# Patient Record
Sex: Male | Born: 1965
Health system: Southern US, Community
[De-identification: ages and names within clinical notes are randomized; demographics above are authoritative.]

## PROBLEM LIST (undated history)

## (undated) DIAGNOSIS — R253 Fasciculation: Secondary | ICD-10-CM

## (undated) HISTORY — DX: Fasciculation: R25.3

---

## 2007-03-04 ENCOUNTER — Emergency Department: Payer: Self-pay | Admitting: Emergency Medicine

## 2007-03-04 ENCOUNTER — Other Ambulatory Visit: Payer: Self-pay

## 2007-03-11 ENCOUNTER — Ambulatory Visit: Payer: Self-pay

## 2009-06-11 IMAGING — CT CT HEAD WITHOUT CONTRAST
1 series · 16 of 30 positions shown, 20 images · non-contrast
Comparison: none

REASON FOR EXAM: Arm tingling
COMMENTS:

PROCEDURE:     CT  - CT HEAD WITHOUT CONTRAST  - March 04, 2007 [DATE]
RESULT:     The patient has a history of arm tingling.
TECHNIQUE: Nonenhanced head CT is performed.

[Series 2: soft tissue · axial · 0.39mm/px · z∈[-131,+4]mm · 16 of 30 slices shown, 20 images]
[im 2/30  brain]
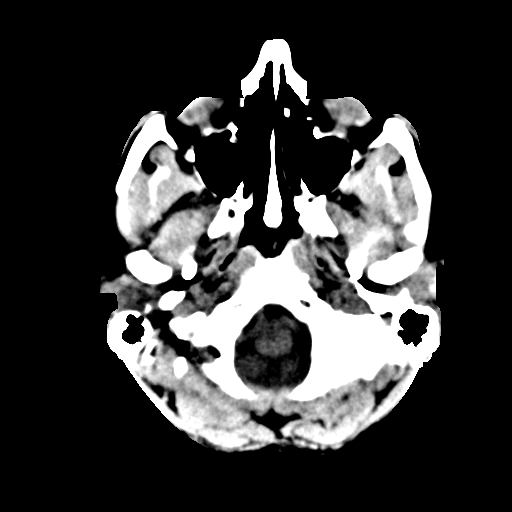
[im 2/30  bone]
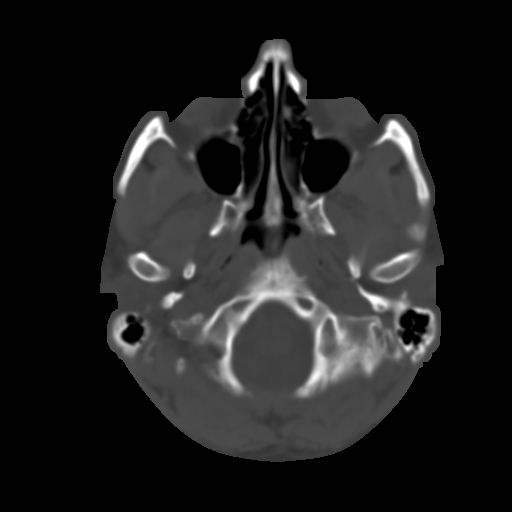
[im 4/30  brain]
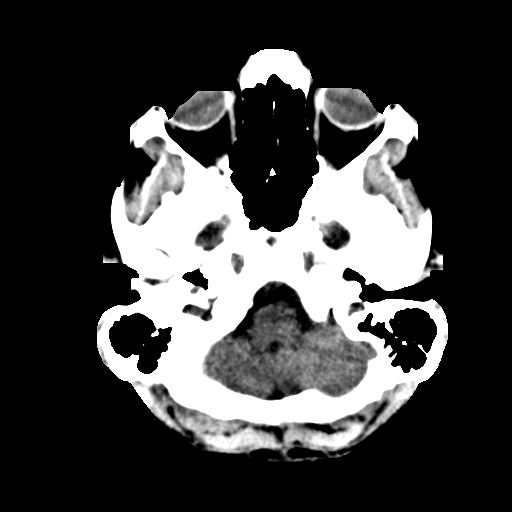
[im 6/30  brain]
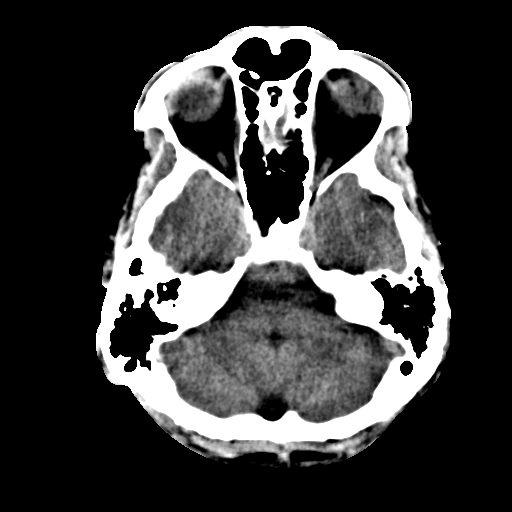
[im 8/30  brain]
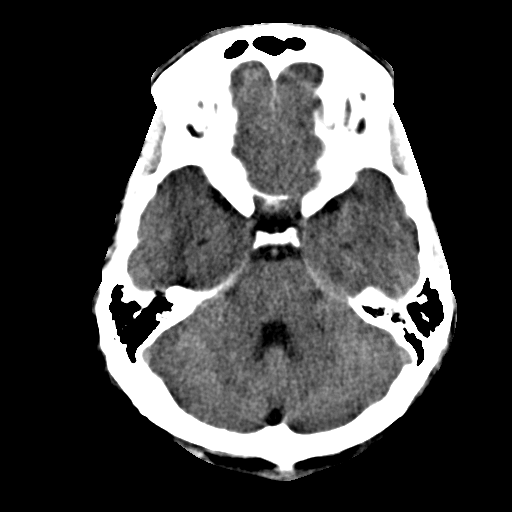
[im 9/30  brain]
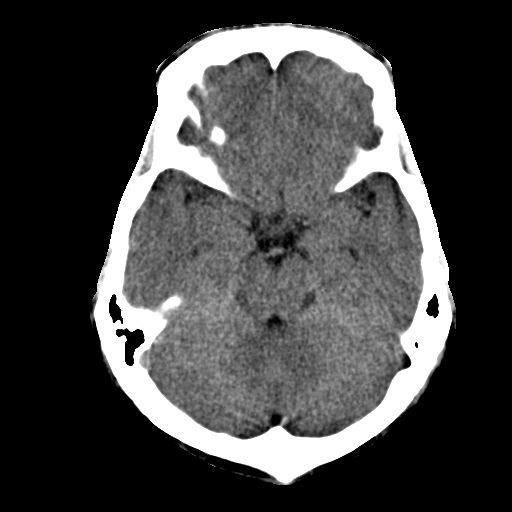
[im 9/30  bone]
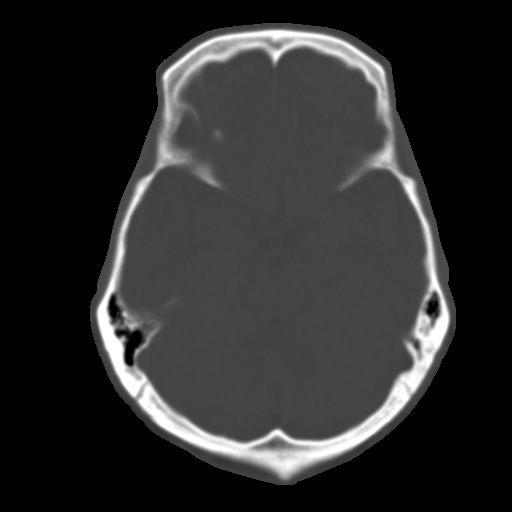
[im 11/30  brain]
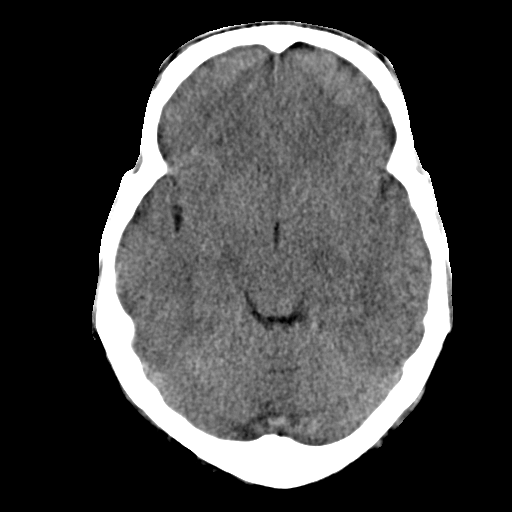
[im 13/30  brain]
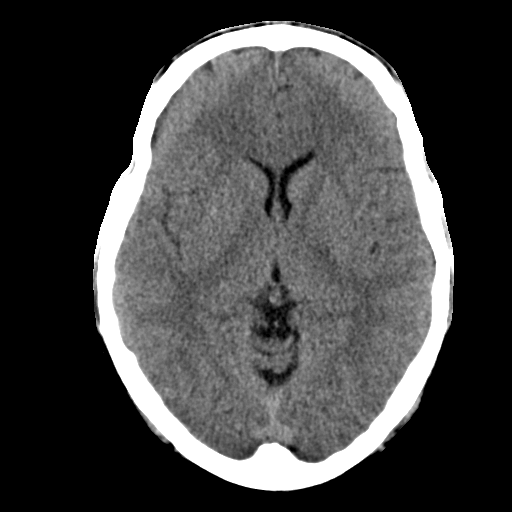
[im 15/30  brain]
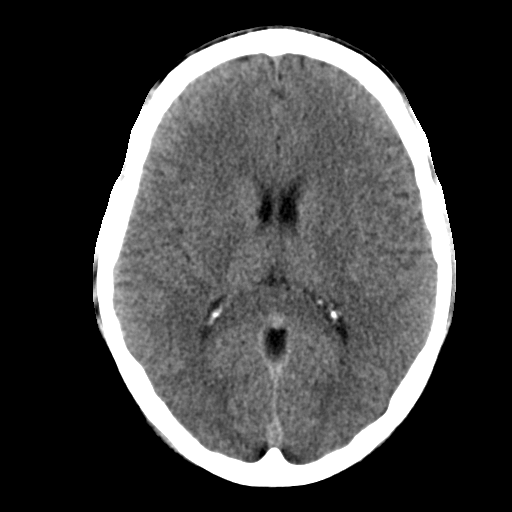
[im 16/30  brain]
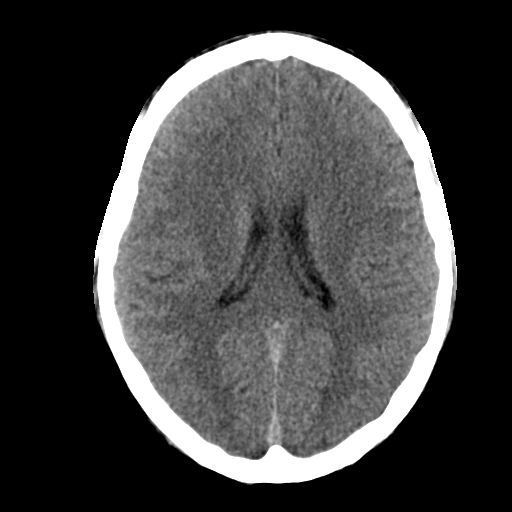
[im 16/30  bone]
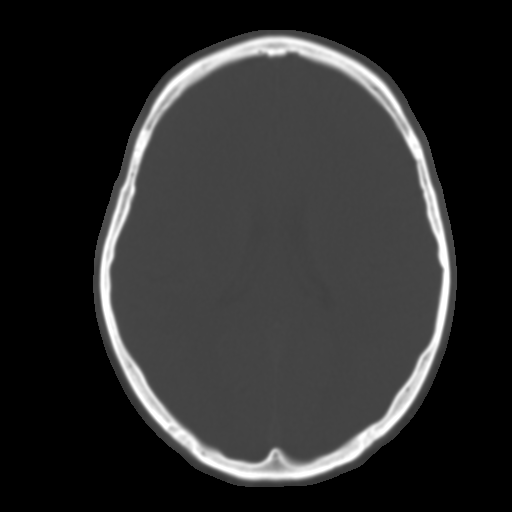
[im 18/30  brain]
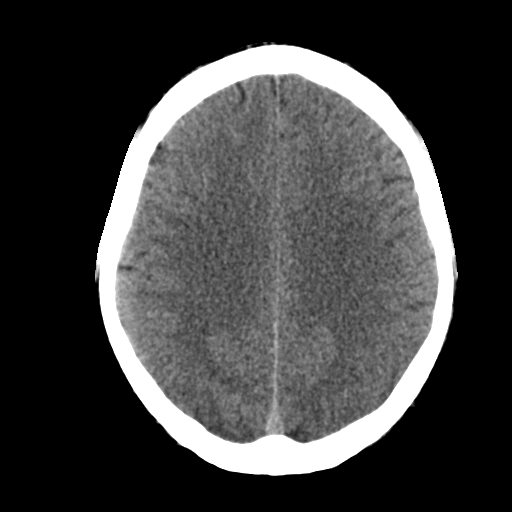
[im 20/30  brain]
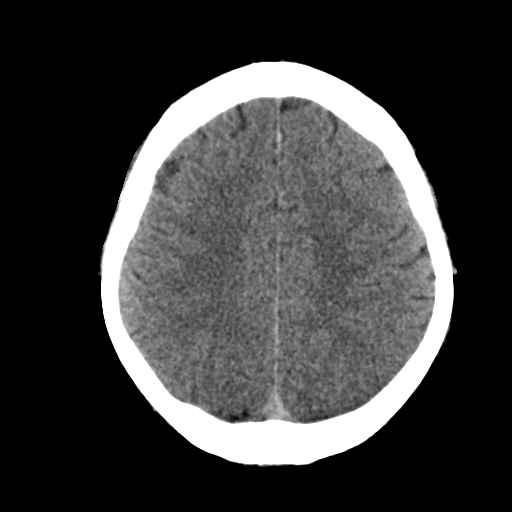
[im 22/30  brain]
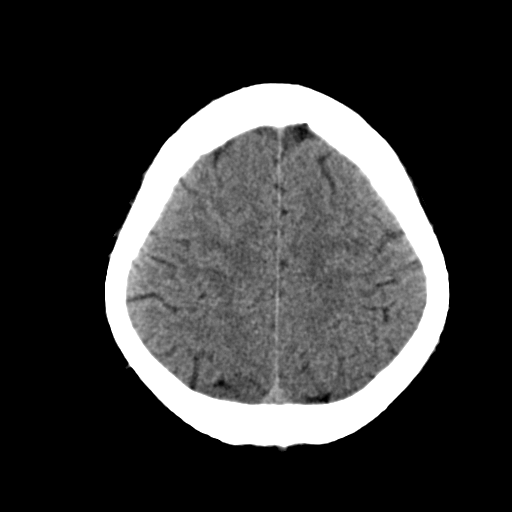
[im 23/30  brain]
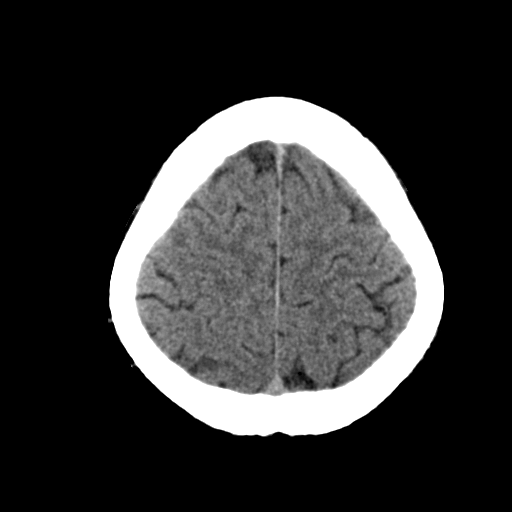
[im 23/30  bone]
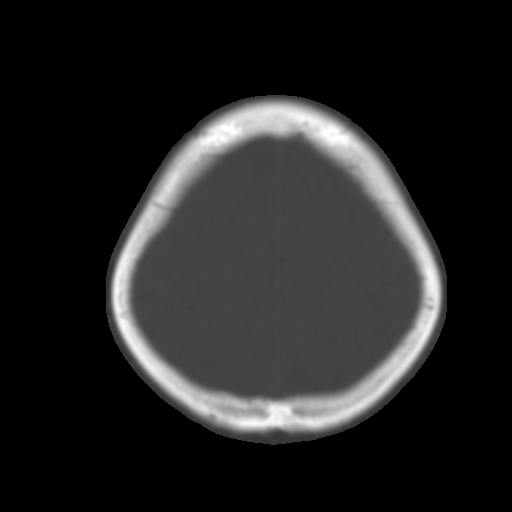
[im 25/30  brain]
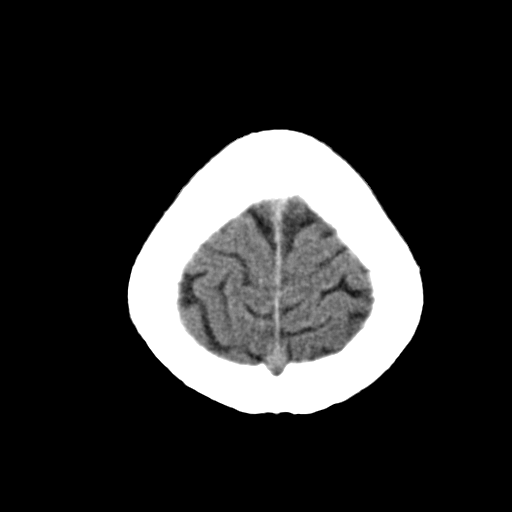
[im 27/30  brain]
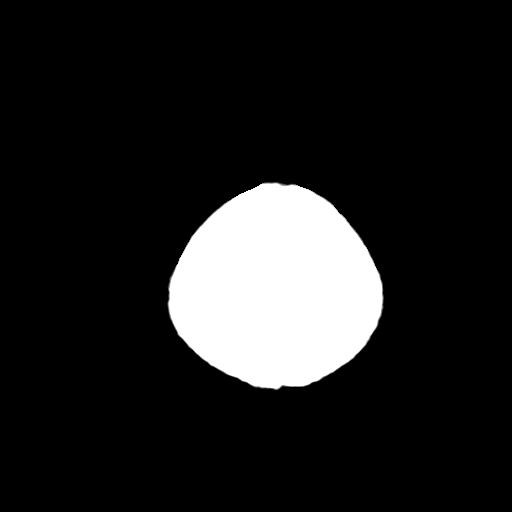
[im 29/30  brain]
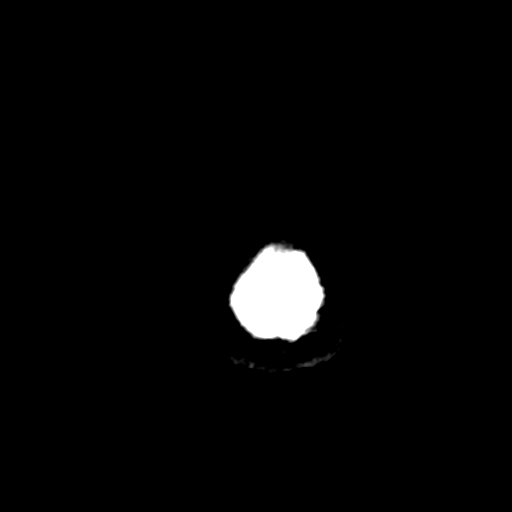

[16 of 30 positions shown; findings below may reference images not displayed]

There are no prior studies available for comparison. Comparison is made to a
prior CT report of 02/19/2003 but the films are not available for review.

No intra-axial or extra-axial pathologic fluid or blood collections are
identified. No mass lesions or hydrocephalus are noted. No bony abnormality
is identified.
IMPRESSION: No acute abnormality.

## 2013-10-01 LAB — BASIC METABOLIC PANEL
BUN: 10 mg/dL (ref 4–21)
Creatinine: 0.9 mg/dL (ref 0.6–1.3)
GLUCOSE: 117 mg/dL
POTASSIUM: 4.9 mmol/L (ref 3.4–5.3)
SODIUM: 138 mmol/L (ref 137–147)

## 2013-10-01 LAB — CBC AND DIFFERENTIAL
HCT: 47 % (ref 41–53)
Hemoglobin: 16.7 g/dL (ref 13.5–17.5)
Platelets: 223 10*3/uL (ref 150–399)
WBC: 5.3 10^3/mL

## 2013-10-01 LAB — HEPATIC FUNCTION PANEL
ALT: 29 U/L (ref 10–40)
AST: 22 U/L (ref 14–40)

## 2015-05-12 ENCOUNTER — Encounter: Payer: Self-pay | Admitting: Physician Assistant

## 2015-05-12 ENCOUNTER — Ambulatory Visit (INDEPENDENT_AMBULATORY_CARE_PROVIDER_SITE_OTHER): Payer: PRIVATE HEALTH INSURANCE | Admitting: Physician Assistant

## 2015-05-12 VITALS — BP 118/80 | HR 98 | Temp 99.6°F | Resp 16 | Wt 154.8 lb

## 2015-05-12 DIAGNOSIS — R05 Cough: Secondary | ICD-10-CM

## 2015-05-12 DIAGNOSIS — R059 Cough, unspecified: Secondary | ICD-10-CM

## 2015-05-12 DIAGNOSIS — J302 Other seasonal allergic rhinitis: Secondary | ICD-10-CM

## 2015-05-12 MED ORDER — FLUTICASONE PROPIONATE 50 MCG/ACT NA SUSP: 2.0000 | Freq: Every day | NASAL | Status: DC

## 2015-05-12 MED ORDER — HYDROCODONE-HOMATROPINE 5-1.5 MG/5ML PO SYRP: 5.0000 mL | ORAL_SOLUTION | Freq: Three times a day (TID) | ORAL | Status: DC | PRN

## 2015-05-12 NOTE — Progress Notes (Signed)
Patient: Brendan Santos Male    DOB: 10/09/1965   50 y.o.   MRN: [id number] Visit Date: 05/12/2015  Today's Provider: Margaretann Loveless, PA-C   Chief Complaint  Patient presents with  . Cough   Subjective:    Cough This is a new problem. The current episode started in the past 7 days (On Monday). The problem has been gradually worsening. The problem occurs hourly. The cough is productive of sputum (Pale white). Associated symptoms include chills, headaches, myalgias, nasal congestion, postnasal drip, rhinorrhea, a sore throat and shortness of breath. Pertinent negatives include no chest pain, ear pain, fever or wheezing. The symptoms are aggravated by lying down. Treatments tried: Took Ibuprofen, Benadryl. The treatment provided no relief.       No Known Allergies Previous Medications   DIPHENHYDRAMINE (BENADRYL) 25 MG TABLET    Take 25 mg by mouth every 6 (six) hours as needed.   LORATADINE (CLARITIN) 10 MG TABLET    Take 10 mg by mouth daily.    Review of Systems  Constitutional: Positive for chills and fatigue. Negative for fever.  HENT: Positive for postnasal drip, rhinorrhea, sinus pressure (He said he suffers from Sinusitis but lately he has notice is more than usual and it seems like he can't get  better), sneezing and sore throat. Negative for ear discharge and ear pain.   Eyes: Positive for pain and itching.  Respiratory: Positive for cough and shortness of breath. Negative for chest tightness and wheezing.   Cardiovascular: Negative for chest pain, palpitations and leg swelling.  Gastrointestinal: Negative for nausea, vomiting and abdominal pain.  Musculoskeletal: Positive for myalgias.  Neurological: Positive for headaches. Negative for dizziness.    Social History  Substance Use Topics  . Smoking status: Never Smoker   . Smokeless tobacco: Not on file  . Alcohol Use: Yes     Comment: Moderate; drinks a few beers during the week, more on the weekend.    Objective:   BP 118/80 mmHg  Pulse 98  Temp(Src) 99.6 F (37.6 C) (Oral)  Resp 16  Wt 154 lb 12.8 oz (70.217 kg)  SpO2 98%  Physical Exam  Constitutional: He appears well-developed and well-nourished. No distress.  HENT:  Head: Normocephalic and atraumatic.  Right Ear: Hearing, tympanic membrane, external ear and ear canal normal. Tympanic membrane is not erythematous and not bulging. No middle ear effusion.  Left Ear: Hearing, tympanic membrane, external ear and ear canal normal. Tympanic membrane is not erythematous and not bulging.  No middle ear effusion.  Nose: Mucosal edema and rhinorrhea present. Right sinus exhibits no maxillary sinus tenderness and no frontal sinus tenderness. Left sinus exhibits no maxillary sinus tenderness and no frontal sinus tenderness.  Mouth/Throat: Uvula is midline, oropharynx is clear and moist and mucous membranes are normal. No oropharyngeal exudate, posterior oropharyngeal edema or posterior oropharyngeal erythema.  Mild ethmoid tenderness  Eyes: Conjunctivae and EOM are normal. Pupils are equal, round, and reactive to light. Right eye exhibits no discharge. Left eye exhibits no discharge.  Neck: Normal range of motion. Neck supple. No tracheal deviation present. No Brudzinski's sign and no Kernig's sign noted. No thyromegaly present.  Cardiovascular: Normal rate, regular rhythm and normal heart sounds.  Exam reveals no gallop and no friction rub.   No murmur heard. Pulmonary/Chest: Effort normal and breath sounds normal. No stridor. No respiratory distress. He has no wheezes. He has no rales.  Lymphadenopathy:    He has  no cervical adenopathy.  Skin: Skin is warm and dry. He is not diaphoretic.  Vitals reviewed.       Assessment & Plan:     1. Other seasonal allergic rhinitis Most likely allergies. Advised to start taking claritin daily.  Will add flonase as below. May continue symptomatic treatment for time being as well. He is to call if  symptoms worsen. - fluticasone (FLONASE) 50 MCG/ACT nasal spray; Place 2 sprays into both nostrils daily.  Dispense: 16 g; Refill: 3  2. Cough Will give hycodan for nighttime cough. Advised of drowsiness precautions. He needs to stay well hydrated and get plenty of sleep. He is to call if no improvement or worsening of symptoms. - HYDROcodone-homatropine (HYCODAN) 5-1.5 MG/5ML syrup; Take 5 mLs by mouth every 8 (eight) hours as needed.  Dispense: 180 mL; Refill: 0       Margaretann LovelessJennifer M Barbarita Hutmacher, PA-C  [facility]

## 2015-05-12 NOTE — Patient Instructions (Signed)
Hay Fever Hay fever is an allergic reaction to particles in the air. It cannot be passed from person to person. It cannot be cured, but it can be controlled. CAUSES  Hay fever is caused by something that triggers an allergic reaction (allergens). The following are examples of allergens:  Ragweed.  Feathers.  Animal dander.  Grass and tree pollens.  Cigarette smoke.  House dust.  Pollution. SYMPTOMS   Sneezing.  Runny or stuffy nose.  Tearing eyes.  Itchy eyes, nose, mouth, throat, skin, or other area.  Sore throat.  Headache.  Decreased sense of smell or taste. DIAGNOSIS Your caregiver will perform a physical exam and ask questions about the symptoms you are having.Allergy testing may be done to determine exactly what triggers your hay fever.  TREATMENT   Over-the-counter medicines may help symptoms. These include:  Antihistamines.  Decongestants. These may help with nasal congestion.  Your caregiver may prescribe medicines if over-the-counter medicines do not work.  Some people benefit from allergy shots when other medicines are not helpful. HOME CARE INSTRUCTIONS   Avoid the allergen that is causing your symptoms, if possible.  Take all medicine as told by your caregiver. SEEK MEDICAL CARE IF:   You have severe allergy symptoms and your current medicines are not helping.  Your treatment was working at one time, but you are now experiencing symptoms.  You have sinus congestion and pressure.  You develop a fever or headache.  You have thick nasal discharge.  You have asthma and have a worsening cough and wheezing. SEEK IMMEDIATE MEDICAL CARE IF:   You have swelling of your tongue or lips.  You have trouble breathing.  You feel lightheaded or like you are going to faint.  You have cold sweats.  You have a fever.   This information is not intended to replace advice given to you by your health care provider. Make sure you discuss any  questions you have with your health care provider.   Document Released: 02/06/2005 Document Revised: 05/01/2011 Document Reviewed: 08/19/2014 Elsevier Interactive Patient Education 2016 Elsevier Inc.  

## 2015-05-13 DIAGNOSIS — S82899A Other fracture of unspecified lower leg, initial encounter for closed fracture: Secondary | ICD-10-CM | POA: Insufficient documentation

## 2015-05-13 DIAGNOSIS — N489 Disorder of penis, unspecified: Secondary | ICD-10-CM | POA: Insufficient documentation

## 2015-05-13 DIAGNOSIS — R253 Fasciculation: Secondary | ICD-10-CM | POA: Insufficient documentation

## 2015-05-13 DIAGNOSIS — F439 Reaction to severe stress, unspecified: Secondary | ICD-10-CM | POA: Insufficient documentation

## 2015-05-13 DIAGNOSIS — G4700 Insomnia, unspecified: Secondary | ICD-10-CM | POA: Insufficient documentation

## 2016-05-30 ENCOUNTER — Emergency Department
Admission: EM | Admit: 2016-05-30 | Discharge: 2016-05-30 | Disposition: A | Discharge status: Home / Self Care | Payer: BLUE CROSS/BLUE SHIELD | Attending: Emergency Medicine | Admitting: Emergency Medicine

## 2016-05-30 ENCOUNTER — Encounter: Payer: Self-pay | Admitting: Emergency Medicine

## 2016-05-30 DIAGNOSIS — R0789 Other chest pain: Secondary | ICD-10-CM | POA: Diagnosis not present

## 2016-05-30 DIAGNOSIS — R079 Chest pain, unspecified: Secondary | ICD-10-CM | POA: Diagnosis not present

## 2016-05-30 LAB — BASIC METABOLIC PANEL
ANION GAP: 7 (ref 5–15)
BUN: 12 mg/dL (ref 6–20)
CHLORIDE: 100 mmol/L — AB (ref 101–111)
CO2: 29 mmol/L (ref 22–32)
Calcium: 9.2 mg/dL (ref 8.9–10.3)
Creatinine, Ser: 0.94 mg/dL (ref 0.61–1.24)
GFR calc Af Amer: >60 mL/min (ref >60)
GFR calc non Af Amer: >60 mL/min (ref >60)
GLUCOSE: 207 mg/dL — AB (ref 65–99)
POTASSIUM: 3.8 mmol/L (ref 3.5–5.1)
Sodium: 136 mmol/L (ref 135–145)

## 2016-05-30 LAB — CBC
HEMATOCRIT: 44.8 % (ref 40.0–52.0)
Hemoglobin: 15.2 g/dL (ref 13.0–18.0)
MCH: 31.7 pg (ref 26.0–34.0)
MCHC: 34 g/dL (ref 32.0–36.0)
MCV: 93.2 fL (ref 80.0–100.0)
Platelets: 187 10*3/uL (ref 150–440)
RBC: 4.8 MIL/uL (ref 4.40–5.90)
RDW: 12.8 % (ref 11.5–14.5)
WBC: 6.3 10*3/uL (ref 3.8–10.6)

## 2016-05-30 LAB — TROPONIN I: Troponin I: <0.03 ng/mL (ref <0.03)

## 2016-05-30 MED ORDER — GI COCKTAIL ~~LOC~~
30.0000 mL | Freq: Once | ORAL | Status: AC
  Administered 2016-05-30: 30 mL via ORAL

## 2016-05-30 MED ORDER — GI COCKTAIL ~~LOC~~
ORAL | Status: AC
  Administered 2016-05-30: 30 mL via ORAL
  Filled 2016-05-30: qty 30

## 2016-05-30 NOTE — ED Notes (Signed)
Pt states L sided CP began yesterday after getting mad. States L arm numbness and cramping. States went to UC today, was given 2 nitro, states didn't help that much. Went to [employer] and had 2 beers which "that helped me some." per pt. Pt denies vomiting, diarrhea, fever. States when pain began yest had some diaphoresis and nausea. No distress noted, alert and oriented.

## 2016-05-30 NOTE — Discharge Instructions (Signed)
Please seek medical attention for any high fevers, chest pain, shortness of breath, change in behavior, persistent vomiting, bloody stool or any other new or concerning symptoms.  

## 2016-05-30 NOTE — ED Triage Notes (Signed)
Pt has had chest pain since yest, states no hx of heart disease. Went to urgent care today and had ekg and cxr done, was given nitro x 2 at the office states now pain seems to be better.

## 2016-05-30 NOTE — ED Provider Notes (Signed)
[facility] Emergency Department Provider Note   ____________________________________________   I have reviewed the triage vital signs and the nursing notes.   HISTORY  Chief Complaint Chest Pain   History limited by: Not Limited   HPI Brendan Santos is a 51 y.o. male who presents to the emergency department today because of concerns for continued chest pain. Patient states that it started yesterday while he was having an argument. He describes it as being intermittently burning and pressure-like. It is located in his left central chest. This has been accompanied by some left arm numbness. Has not had any pain in his left arm. Initially went to urgent care where he was given some nitroglycerin which he says might have helped but only very temporarily. The pain returned with 10 minutes. He denies similar pain in the past.   Past Medical History:  Diagnosis Date  . Muscle twitch     Patient Active Problem List   Diagnosis Date Noted  . Closed fracture of ankle 05/13/2015  . Cannot sleep 05/13/2015  . Lesion of penis 05/13/2015  . Muscle twitch 05/13/2015  . Feeling stressed out 05/13/2015    No past surgical history on file.  Prior to Admission medications   Medication Sig Start Date End Date Taking? Authorizing Provider  diphenhydrAMINE (BENADRYL) 25 MG tablet Take 25 mg by mouth every 6 (six) hours as needed.    Historical Provider, MD  fluticasone (FLONASE) 50 MCG/ACT nasal spray Place 2 sprays into both nostrils daily. 05/12/15   Margaretann Loveless, PA-C  HYDROcodone-homatropine (HYCODAN) 5-1.5 MG/5ML syrup Take 5 mLs by mouth every 8 (eight) hours as needed. 05/12/15   Margaretann Loveless, PA-C  loratadine (CLARITIN) 10 MG tablet Take 10 mg by mouth daily.    Historical Provider, MD    Allergies Patient has no known allergies.  Family History  Problem Relation Age of Onset  . Diabetes Mother   . Heart disease Father   . Cancer Father    . Cancer Paternal Grandmother     Social History Social History  Substance Use Topics  . Smoking status: Never Smoker  . Smokeless tobacco: Not on file  . Alcohol use Yes     Comment: Moderate; drinks a few beers during the week, more on the weekend.    Review of Systems  Constitutional: Negative for fever. Cardiovascular: Positive for chest pain. Respiratory: Negative for shortness of breath. Gastrointestinal: Negative for abdominal pain, vomiting and diarrhea. Genitourinary: Negative for dysuria. Musculoskeletal: Positive for left arm numbness. Skin: Negative for rash. Neurological: Negative for headaches, focal weakness or numbness.  10-point ROS otherwise negative.  ____________________________________________   PHYSICAL EXAM:  VITAL SIGNS: ED Triage Vitals  Enc Vitals Group     BP 05/30/16 1917 108/90     Pulse Rate 05/30/16 1917 73     Resp 05/30/16 1917 18     Temp 05/30/16 1917 97.8 F (36.6 C)     Temp Source 05/30/16 1917 Oral     SpO2 05/30/16 1917 98 %     Weight 05/30/16 1912 155 lb (70.3 kg)     Height 05/30/16 1912  (1.651 m)     Head Circumference --      Peak Flow --      Pain Score 05/30/16 1912 2   Constitutional: Alert and oriented. Well appearing and in no distress. Eyes: Conjunctivae are normal. Normal extraocular movements. ENT   Head: Normocephalic and atraumatic.   Nose:  No congestion/rhinnorhea.   Mouth/Throat: Mucous membranes are moist.   Neck: No stridor. Hematological/Lymphatic/Immunilogical: No cervical lymphadenopathy. Cardiovascular: Normal rate, regular rhythm.  No murmurs, rubs, or gallops.  Respiratory: Normal respiratory effort without tachypnea nor retractions. Breath sounds are clear and equal bilaterally. No wheezes/rales/rhonchi. Gastrointestinal: Soft and non tender. No rebound. No guarding.  Genitourinary: Deferred Musculoskeletal: Normal range of motion in all extremities. No lower extremity  edema. Neurologic:  Normal speech and language. No gross focal neurologic deficits are appreciated.  Skin:  Skin is warm, dry and intact. No rash noted. Psychiatric: Mood and affect are normal. Speech and behavior are normal. Patient exhibits appropriate insight and judgment.  ____________________________________________    LABS (pertinent positives/negatives)  Labs Reviewed  BASIC METABOLIC PANEL - Abnormal; Notable for the following:       Result Value   Chloride 100 (*)    Glucose, Bld 207 (*)    All other components within normal limits  CBC  TROPONIN I     ____________________________________________   EKG  I, Phineas Semen, attending physician, personally viewed and interpreted this EKG  EKG Time: 1913 Rate: 73 Rhythm: normal sinus rhythm Axis: normal Intervals: qtc 401 QRS: narrow ST changes: no st elevation Impression: normal ekg   ____________________________________________    RADIOLOGY  None  ____________________________________________   PROCEDURES  Procedures  ____________________________________________   INITIAL IMPRESSION / ASSESSMENT AND PLAN / ED COURSE  Pertinent labs & imaging results that were available during my care of the patient were reviewed by me and considered in my medical decision making (see chart for details).  Patient presented to the emergency department because of chest pain. Has been constant since yesterday. Troponin was negative here. At this point I think if chest pain represented ACS out expect some elevation of the troponin. At this point unclear etiology. This did start during an argument and when her stress or anxiety could be related. Will give patient a cardiology follow-up information.  ____________________________________________   FINAL CLINICAL IMPRESSION(S) / ED DIAGNOSES  Final diagnoses:  Nonspecific chest pain     Note: This dictation was prepared with Dragon dictation. Any transcriptional  errors that result from this process are unintentional     Phineas Semen, MD 05/31/16 1502

## 2016-05-31 ENCOUNTER — Encounter: Payer: PRIVATE HEALTH INSURANCE | Admitting: Family Medicine

## 2016-06-02 DIAGNOSIS — R079 Chest pain, unspecified: Secondary | ICD-10-CM | POA: Insufficient documentation

## 2016-07-31 ENCOUNTER — Other Ambulatory Visit: Payer: Self-pay | Admitting: Family Medicine

## 2016-07-31 ENCOUNTER — Other Ambulatory Visit: Payer: Self-pay

## 2016-07-31 ENCOUNTER — Encounter: Payer: Self-pay | Admitting: Family Medicine

## 2016-07-31 ENCOUNTER — Ambulatory Visit (INDEPENDENT_AMBULATORY_CARE_PROVIDER_SITE_OTHER): Payer: BLUE CROSS/BLUE SHIELD | Admitting: Family Medicine

## 2016-07-31 VITALS — BP 102/64 | HR 76 | Temp 98.5°F | Resp 16 | Ht 65.5 in | Wt 150.4 lb

## 2016-07-31 DIAGNOSIS — Z113 Encounter for screening for infections with a predominantly sexual mode of transmission: Secondary | ICD-10-CM | POA: Diagnosis not present

## 2016-07-31 DIAGNOSIS — Z125 Encounter for screening for malignant neoplasm of prostate: Secondary | ICD-10-CM | POA: Diagnosis not present

## 2016-07-31 DIAGNOSIS — Z13220 Encounter for screening for lipoid disorders: Secondary | ICD-10-CM | POA: Diagnosis not present

## 2016-07-31 DIAGNOSIS — Z1211 Encounter for screening for malignant neoplasm of colon: Secondary | ICD-10-CM

## 2016-07-31 DIAGNOSIS — M20029 Boutonniere deformity of unspecified finger(s): Secondary | ICD-10-CM | POA: Insufficient documentation

## 2016-07-31 DIAGNOSIS — Z0000 Encounter for general adult medical examination without abnormal findings: Secondary | ICD-10-CM

## 2016-07-31 DIAGNOSIS — Z131 Encounter for screening for diabetes mellitus: Secondary | ICD-10-CM

## 2016-07-31 NOTE — Patient Instructions (Addendum)
We will call you with the lab results. Place hydrocortisone cream on your penis rash 2-3 x day.

## 2016-07-31 NOTE — Progress Notes (Signed)
Subjective:     Patient ID: Brendan Santos, male   DOB: Jun 26, 1965, 51 y.o.   MRN: [id number]  HPI  Chief Complaint  Patient presents with  . Annual Exam    Patient comes into office today for his annual physical, patient states that he is feeling well today and has no complaints or concerns. Paitent reports that he sleeps on average 6hrs a night, is following a well balanced diet, patient is not actively exercising and states his libido is normal.Last reported Tdap was 10/18/10, patient is due today for screening labs and colonoscopy screen.   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   Review of Systems General: Feeling well HEENT: regular dental visits and eye exam pending. Cardiovascular: no chest pain, shortness of breath, or palpitations GI: no heartburn, no change in bowel habits or blood in the stool GU: nocturia x 0, no change in bladder habits. Reports unprotected sex 6 months ago (none since then) and has noticed an itchy bump on his penis yesterday. Hx of HPV treated with cryotherapy. Psychiatric: not depressed Musculoskeletal: no joint pain    Objective:   Physical Exam  Constitutional: He appears well-developed and well-nourished. No distress.  Eyes: PERRLA, EOMI Neck: no thyromegaly, tenderness or nodules, no cervical adenopathy or carotid bruits. ENT: TM's intact without inflammation; No tonsillar enlargement or exudate, Lungs: Clear Heart : RRR without murmur or gallop Abd: bowel sounds present, soft, non-tender, no organomegaly Rectal: Prostate firm and non-tender,  Extremities: no edema, Skin: 0.5 cm raised linear rash noted on his penis shaft. No vesicles.     Assessment:    1. Screen for STD (sexually transmitted disease) - HIV antibody - GC/chlamydia probe amp, urine - HSV(herpes simplex vrs) 1+2 ab-IgG - RPR  2. Annual physical exam  3. Screening for cholesterol level - Lipid panel  4. Screening for diabetes mellitus - Comprehensive metabolic  panel  5. Screen for colon cancer - Ambulatory referral to Gastroenterology  6. Screening for prostate cancer - PSA    Plan:    Further f/u pending lab work. Discussed using hydrocortisone cream on his penis.

## 2016-08-02 LAB — SPECIMEN STATUS REPORT

## 2016-08-02 LAB — GC/CHLAMYDIA PROBE AMP
Chlamydia trachomatis, NAA: NEGATIVE
NEISSERIA GONORRHOEAE BY PCR: NEGATIVE

## 2016-08-07 NOTE — Progress Notes (Signed)
Advised  ED 

## 2016-08-15 ENCOUNTER — Other Ambulatory Visit: Payer: Self-pay

## 2016-08-15 ENCOUNTER — Telehealth: Payer: Self-pay

## 2016-08-15 DIAGNOSIS — Z1211 Encounter for screening for malignant neoplasm of colon: Secondary | ICD-10-CM

## 2016-08-15 NOTE — Telephone Encounter (Signed)
Gastroenterology Pre-Procedure Review  Request Date:  Requesting Physician: Dr.   PATIENT REVIEW QUESTIONS: The patient responded to the following health history questions as indicated:    1. Are you having any GI issues? no 2. Do you have a personal history of Polyps? no 3. Do you have a family history of Colon Cancer or Polyps? no 4. Diabetes Mellitus? no 5. Joint replacements in the past 12 months?no 6. Major health problems in the past 3 months?no 7. Any artificial heart valves, MVP, or defibrillator?no    MEDICATIONS & ALLERGIES:    Patient reports the following regarding taking any anticoagulation/antiplatelet therapy:   Plavix, Coumadin, Eliquis, Xarelto, Lovenox, Pradaxa, Brilinta, or Effient? no Aspirin? no  Patient confirms/reports the following medications:  Current Outpatient Prescriptions  Medication Sig Dispense Refill  . fluticasone (FLONASE) 50 MCG/ACT nasal spray Place 2 sprays into both nostrils daily. 16 g 3  . ofloxacin (OCUFLOX) 0.3 % ophthalmic solution ofloxacin 0.3 % eye drops     No current facility-administered medications for this visit.     Patient confirms/reports the following allergies:  Allergies  Allergen Reactions  . No Known Allergies     No orders of the defined types were placed in this encounter.   AUTHORIZATION INFORMATION Primary Insurance: 1D#: Group #:  Secondary Insurance: 1D#: Group #:  SCHEDULE INFORMATION: Date: 09/14/16 Time: Location: [facility]

## 2016-09-05 ENCOUNTER — Telehealth: Payer: Self-pay | Admitting: Gastroenterology

## 2016-09-05 NOTE — Telephone Encounter (Signed)
Due to work schedule patient has to cx his procedure.

## 2016-09-06 NOTE — Telephone Encounter (Signed)
Patient contacted to let him know his colonoscopy has been canceled as he requested and to call back to reschedule when ready.

## 2016-09-14 ENCOUNTER — Encounter: Admission: RE | Payer: Self-pay | Source: Ambulatory Visit

## 2016-09-14 ENCOUNTER — Ambulatory Visit
Admission: RE | Admit: 2016-09-14 | Payer: BLUE CROSS/BLUE SHIELD | Source: Ambulatory Visit | Admitting: Gastroenterology

## 2016-09-14 SURGERY — COLONOSCOPY WITH PROPOFOL
Anesthesia: General

## 2016-10-04 DIAGNOSIS — Z125 Encounter for screening for malignant neoplasm of prostate: Secondary | ICD-10-CM | POA: Diagnosis not present

## 2016-10-04 DIAGNOSIS — Z131 Encounter for screening for diabetes mellitus: Secondary | ICD-10-CM | POA: Diagnosis not present

## 2016-10-04 DIAGNOSIS — Z13220 Encounter for screening for lipoid disorders: Secondary | ICD-10-CM | POA: Diagnosis not present

## 2016-10-04 DIAGNOSIS — Z113 Encounter for screening for infections with a predominantly sexual mode of transmission: Secondary | ICD-10-CM | POA: Diagnosis not present

## 2016-10-05 ENCOUNTER — Encounter: Payer: Self-pay | Admitting: Family Medicine

## 2016-10-05 DIAGNOSIS — B009 Herpesviral infection, unspecified: Secondary | ICD-10-CM | POA: Insufficient documentation

## 2016-10-05 LAB — LIPID PANEL
CHOLESTEROL TOTAL: 291 mg/dL — AB (ref 100–199)
Chol/HDL Ratio: 4.3 ratio (ref 0.0–5.0)
HDL: 67 mg/dL (ref >39)
LDL Calculated: 205 mg/dL — ABNORMAL HIGH (ref 0–99)
Triglycerides: 94 mg/dL (ref 0–149)
VLDL Cholesterol Cal: 19 mg/dL (ref 5–40)

## 2016-10-05 LAB — COMPREHENSIVE METABOLIC PANEL
ALBUMIN: 4.6 g/dL (ref 3.5–5.5)
ALT: 67 IU/L — ABNORMAL HIGH (ref 0–44)
AST: 41 IU/L — AB (ref 0–40)
Albumin/Globulin Ratio: 1.7 (ref 1.2–2.2)
Alkaline Phosphatase: 58 IU/L (ref 39–117)
BUN/Creatinine Ratio: 13 (ref 9–20)
BUN: 12 mg/dL (ref 6–24)
Bilirubin Total: 0.4 mg/dL (ref 0.0–1.2)
CALCIUM: 9.1 mg/dL (ref 8.7–10.2)
CO2: 27 mmol/L (ref 20–29)
CREATININE: 0.92 mg/dL (ref 0.76–1.27)
Chloride: 101 mmol/L (ref 96–106)
GFR calc Af Amer: 111 mL/min/{1.73_m2} (ref >59)
GFR, EST NON AFRICAN AMERICAN: 96 mL/min/{1.73_m2} (ref >59)
GLOBULIN, TOTAL: 2.7 g/dL (ref 1.5–4.5)
Glucose: 103 mg/dL — ABNORMAL HIGH (ref 65–99)
Potassium: 4.8 mmol/L (ref 3.5–5.2)
SODIUM: 142 mmol/L (ref 134–144)
Total Protein: 7.3 g/dL (ref 6.0–8.5)

## 2016-10-05 LAB — HSV(HERPES SIMPLEX VRS) I + II AB-IGG: HSV 1 Glycoprotein G Ab, IgG: 51.3 index — ABNORMAL HIGH (ref 0.00–0.90)

## 2016-10-05 LAB — PSA: PROSTATE SPECIFIC AG, SERUM: 1.2 ng/mL (ref 0.0–4.0)

## 2016-10-05 LAB — HIV ANTIBODY (ROUTINE TESTING W REFLEX): HIV SCREEN 4TH GENERATION: NONREACTIVE

## 2016-10-05 LAB — RPR: RPR: NONREACTIVE

## 2016-10-06 ENCOUNTER — Telehealth: Payer: Self-pay

## 2016-10-06 NOTE — Telephone Encounter (Signed)
[id number] 

## 2016-10-06 NOTE — Telephone Encounter (Signed)
Patient was advised of lab report. KW 

## 2016-10-06 NOTE — Telephone Encounter (Signed)
-----   Message from Anola Gurney, [place] sent at 10/05/2016  8:08 AM EDT ----- Cholesterol is elevated but your calculated 10 year risk for developing cardiovascular disease is low at 3.2%. We usually recommend cholesterol lowering drugs at 7.5% risk. 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 The remainder of your STD screen is ok. You have very mild elevation of a couple of liver tests. May wish to check this again in the next month or so. Avoid alcohol a week prior to testing.

## 2018-01-15 ENCOUNTER — Encounter: Payer: Self-pay | Admitting: Family Medicine

## 2018-01-15 ENCOUNTER — Ambulatory Visit: Payer: BLUE CROSS/BLUE SHIELD | Admitting: Family Medicine

## 2018-01-15 VITALS — BP 122/86 | HR 88 | Temp 97.9°F | Resp 16 | Wt 145.2 lb

## 2018-01-15 DIAGNOSIS — J069 Acute upper respiratory infection, unspecified: Secondary | ICD-10-CM | POA: Diagnosis not present

## 2018-01-15 DIAGNOSIS — Z113 Encounter for screening for infections with a predominantly sexual mode of transmission: Secondary | ICD-10-CM | POA: Diagnosis not present

## 2018-01-15 NOTE — Patient Instructions (Addendum)
Discussed use of Mucinex D for congestion, Delsym for cough, and Benadryl for postnasal drainage.We will call you with the lab results. 

## 2018-01-15 NOTE — Progress Notes (Addendum)
  Subjective:     Patient ID: Fonda Kinderlinton L Yeakle, male   DOB: 05/04/1965, 52 y.o.   MRN: [id number] Chief Complaint  Patient presents with  . Sore Throat    Patient comes in office today with concerns of cold like symptoms for the past 3 days. Patient reports cough, post nasal drop and sinus pain associated. Patient has been taking otc Aleve and nasal spray for relief.    HPI States he is feeling better today. Also states he wishes to have an STD screen. Reports a contact a few months ago but  denies sx.   Review of Systems     Objective:   Physical Exam  Constitutional: He appears well-developed and well-nourished. No distress.  Ears: T.M's intact without inflammation Throat: no tonsillar enlargement or exudate Neck: no cervical adenopathy Lungs: clear     Assessment:    1. URI, acute  2. Screen for STD (sexually transmitted disease) - HIV Antibody (routine testing w rflx) - RPR    Plan:    Discussed otc medication. Further f/u pending lab results. Work excuse for 11/25-11/27/19.

## 2018-01-16 ENCOUNTER — Telehealth: Payer: Self-pay

## 2018-01-16 LAB — HIV ANTIBODY (ROUTINE TESTING W REFLEX): HIV Screen 4th Generation wRfx: NONREACTIVE

## 2018-01-16 LAB — RPR: RPR Ser Ql: NONREACTIVE

## 2018-01-16 NOTE — Telephone Encounter (Signed)
-----   Message from Anola Gurneyobert Chauvin, [place] sent at 01/16/2018  7:22 AM EST ----- HIV and syphilis labs normal

## 2018-01-16 NOTE — Telephone Encounter (Signed)
Advised  ED 

## 2018-01-16 NOTE — Telephone Encounter (Signed)
[id number] 

## 2019-01-15 DIAGNOSIS — H9211 Otorrhea, right ear: Secondary | ICD-10-CM | POA: Diagnosis not present

## 2019-01-15 DIAGNOSIS — H9201 Otalgia, right ear: Secondary | ICD-10-CM | POA: Diagnosis not present

## 2019-02-06 DIAGNOSIS — L570 Actinic keratosis: Secondary | ICD-10-CM | POA: Diagnosis not present

## 2019-02-06 DIAGNOSIS — L218 Other seborrheic dermatitis: Secondary | ICD-10-CM | POA: Diagnosis not present

## 2019-02-06 DIAGNOSIS — L821 Other seborrheic keratosis: Secondary | ICD-10-CM | POA: Diagnosis not present

## 2019-02-06 DIAGNOSIS — C44619 Basal cell carcinoma of skin of left upper limb, including shoulder: Secondary | ICD-10-CM | POA: Diagnosis not present

## 2019-02-06 DIAGNOSIS — D492 Neoplasm of unspecified behavior of bone, soft tissue, and skin: Secondary | ICD-10-CM | POA: Diagnosis not present

## 2019-03-10 DIAGNOSIS — R259 Unspecified abnormal involuntary movements: Secondary | ICD-10-CM | POA: Diagnosis not present

## 2019-03-10 DIAGNOSIS — G479 Sleep disorder, unspecified: Secondary | ICD-10-CM | POA: Diagnosis not present

## 2019-03-10 DIAGNOSIS — M62838 Other muscle spasm: Secondary | ICD-10-CM | POA: Diagnosis not present

## 2019-05-26 ENCOUNTER — Ambulatory Visit: Payer: BC Managed Care – PPO | Attending: Internal Medicine

## 2019-06-12 ENCOUNTER — Ambulatory Visit: Payer: BC Managed Care – PPO | Attending: Internal Medicine

## 2019-06-12 DIAGNOSIS — Z23 Encounter for immunization: Secondary | ICD-10-CM

## 2019-06-12 NOTE — Progress Notes (Signed)
   Covid-19 Vaccination Clinic  Name:  KALED ALLENDE    MRN: [id number] DOB: [birth year]  06/12/2019  Mr. Edgerly was observed post Covid-19 immunization for 15 minutes without incident. He was provided with Vaccine Information Sheet and instruction to access the V-Safe system.   Mr. Drost was instructed to call 911 with any severe reactions post vaccine: Marland Kitchen Difficulty breathing  . Swelling of face and throat  . A fast heartbeat  . A bad rash all over body  . Dizziness and weakness   Immunizations Administered    Name Date Dose VIS Date Route   Pfizer COVID-19 Vaccine 06/12/2019  9:34 AM 0.3 mL 04/16/2018 Intramuscular   Manufacturer: [employer], [employer]   Lot: [id number]   NDC: [address]-3

## 2019-07-08 ENCOUNTER — Ambulatory Visit: Payer: BC Managed Care – PPO | Attending: Internal Medicine

## 2019-07-08 DIAGNOSIS — Z23 Encounter for immunization: Secondary | ICD-10-CM

## 2019-07-08 NOTE — Progress Notes (Signed)
   Covid-19 Vaccination Clinic  Name:  Brendan Santos    MRN: [id number] DOB: 1965-05-18  07/08/2019  [name] was observed post Covid-19 immunization for 15 minutes without incident. He was provided with Vaccine Information Sheet and instruction to access the V-Safe system.   [name] was instructed to call 911 with any severe reactions post vaccine: Marland Kitchen Difficulty breathing  . Swelling of face and throat  . A fast heartbeat  . A bad rash all over body  . Dizziness and weakness   Immunizations Administered    Name Date Dose VIS Date Route   Pfizer COVID-19 Vaccine 07/08/2019  9:25 AM 0.3 mL 04/16/2018 Intramuscular   Manufacturer: [employer], [employer]   Lot: [id number]   NDC: [address]-8

## 2020-05-28 ENCOUNTER — Ambulatory Visit: Payer: Self-pay

## 2020-05-28 NOTE — Telephone Encounter (Signed)
Noted  

## 2020-05-28 NOTE — Telephone Encounter (Signed)
Pt. Started having cough and congestion 1 week ago. Started having chest pain 3 days ago. Radiates into his jaw and back. Pain 7/10. Has shortness of breath, fatigue. Pain has been constant. Instructed to call 911. And go to ED. Verbalizes understanding.  Reason for Disposition . Pain also in shoulder(s) or arm(s) or jaw (Exception: pain is clearly made worse by movement)  Answer Assessment - Initial Assessment Questions 1. LOCATION: "Where does it hurt?"       Center 2. RADIATION: "Does the pain go anywhere else?" (e.g., into neck, jaw, arms, back)     Jaw, back 3. ONSET: "When did the chest pain begin?" (Minutes, hours or days)      3 days 4. PATTERN "Does the pain come and go, or has it been constant since it started?"  "Does it get worse with exertion?"      Constant 5. DURATION: "How long does it last" (e.g., seconds, minutes, hours)     Constant 6. SEVERITY: "How bad is the pain?"  (e.g., Scale 1-10; mild, moderate, or severe)    - MILD (1-3): doesn't interfere with normal activities     - MODERATE (4-7): interferes with normal activities or awakens from sleep    - SEVERE (8-10): excruciating pain, unable to do any normal activities       Now- 7 7. CARDIAC RISK FACTORS: "Do you have any history of heart problems or risk factors for heart disease?" (e.g., angina, prior heart attack; diabetes, high blood pressure, high cholesterol, smoker, or strong family history of heart disease)     No 8. PULMONARY RISK FACTORS: "Do you have any history of lung disease?"  (e.g., blood clots in lung, asthma, emphysema, birth control pills)     No 9. CAUSE: "What do you think is causing the chest pain?"     Unsure 10. OTHER SYMPTOMS: "Do you have any other symptoms?" (e.g., dizziness, nausea, vomiting, sweating, fever, difficulty breathing, cough)       Cough, congestion,fever,shortness of breath,fatigue 11. PREGNANCY: "Is there any chance you are pregnant?" "When was your last menstrual period?"        n/a  Protocols used: CHEST PAIN-A-AH

## 2021-03-12 ENCOUNTER — Other Ambulatory Visit: Payer: Self-pay

## 2021-03-12 ENCOUNTER — Emergency Department
Admission: EM | Admit: 2021-03-12 | Discharge: 2021-03-12 | Disposition: A | Discharge status: Home / Self Care | Payer: BC Managed Care – PPO | Attending: Emergency Medicine | Admitting: Emergency Medicine

## 2021-03-12 ENCOUNTER — Emergency Department: Payer: BC Managed Care – PPO

## 2021-03-12 DIAGNOSIS — S39011A Strain of muscle, fascia and tendon of abdomen, initial encounter: Secondary | ICD-10-CM | POA: Diagnosis not present

## 2021-03-12 DIAGNOSIS — R109 Unspecified abdominal pain: Secondary | ICD-10-CM | POA: Diagnosis present

## 2021-03-12 DIAGNOSIS — T148XXA Other injury of unspecified body region, initial encounter: Secondary | ICD-10-CM

## 2021-03-12 DIAGNOSIS — X58XXXA Exposure to other specified factors, initial encounter: Secondary | ICD-10-CM | POA: Insufficient documentation

## 2021-03-12 LAB — URINALYSIS, ROUTINE W REFLEX MICROSCOPIC
Bilirubin Urine: NEGATIVE
Glucose, UA: NEGATIVE mg/dL
Hgb urine dipstick: NEGATIVE
Ketones, ur: 5 mg/dL — AB
Leukocytes,Ua: NEGATIVE
Nitrite: NEGATIVE
Protein, ur: NEGATIVE mg/dL
Specific Gravity, Urine: 1.016 (ref 1.005–1.030)
pH: 5 (ref 5.0–8.0)

## 2021-03-12 LAB — CBC
HCT: 44.2 % (ref 39.0–52.0)
Hemoglobin: 14.8 g/dL (ref 13.0–17.0)
MCH: 32.3 pg (ref 26.0–34.0)
MCHC: 33.5 g/dL (ref 30.0–36.0)
MCV: 96.5 fL (ref 80.0–100.0)
Platelets: 232 10*3/uL (ref 150–400)
RBC: 4.58 MIL/uL (ref 4.22–5.81)
RDW: 11.9 % (ref 11.5–15.5)
WBC: 4.2 10*3/uL (ref 4.0–10.5)
nRBC: 0 % (ref 0.0–0.2)

## 2021-03-12 LAB — COMPREHENSIVE METABOLIC PANEL
ALT: 25 U/L (ref 0–44)
AST: 21 U/L (ref 15–41)
Albumin: 4.1 g/dL (ref 3.5–5.0)
Alkaline Phosphatase: 55 U/L (ref 38–126)
Anion gap: 12 (ref 5–15)
BUN: 11 mg/dL (ref 6–20)
CO2: 29 mmol/L (ref 22–32)
Calcium: 9.3 mg/dL (ref 8.9–10.3)
Chloride: 100 mmol/L (ref 98–111)
Creatinine, Ser: 0.96 mg/dL (ref 0.61–1.24)
GFR, Estimated: >60 mL/min (ref >60)
Glucose, Bld: 119 mg/dL — ABNORMAL HIGH (ref 70–99)
Potassium: 3.8 mmol/L (ref 3.5–5.1)
Sodium: 141 mmol/L (ref 135–145)
Total Bilirubin: 0.8 mg/dL (ref 0.3–1.2)
Total Protein: 7.7 g/dL (ref 6.5–8.1)

## 2021-03-12 LAB — LIPASE, BLOOD: Lipase: 51 U/L (ref 11–51)

## 2021-03-12 MED ORDER — IOHEXOL 300 MG/ML  SOLN
100.0000 mL | Freq: Once | INTRAMUSCULAR | Status: AC | PRN
  Administered 2021-03-12: 100 mL via INTRAVENOUS
  Filled 2021-03-12: qty 100

## 2021-03-12 MED ORDER — CYCLOBENZAPRINE HCL 5 MG PO TABS: 5.0000 mg | ORAL_TABLET | Freq: Three times a day (TID) | ORAL | 0 refills | Status: DC | PRN

## 2021-03-12 MED ORDER — LIDOCAINE 5 % EX PTCH: 1.0000 | MEDICATED_PATCH | Freq: Two times a day (BID) | CUTANEOUS | 0 refills | Status: DC

## 2021-03-12 NOTE — ED Triage Notes (Signed)
Pt states 10 weeks of right sided flank pain. Pt appears in no acute distress, pt states yesterday pain was 9/10 but has improved today.

## 2021-03-12 NOTE — ED Provider Notes (Signed)
[facility] Provider Note    Event Date/Time   First MD Initiated Contact with Patient 03/12/21 1224     (approximate)   History   Chief Complaint Flank Pain   HPI  Brendan Santos is a 56 y.o. male with no significant past medical history presents to the ED complaining of flank pain.  Patient reports that he has had a couple months of gradually increasing pain in the area of his right flank.  Pain started out as intermittent but has now become constant and sharp, not exacerbated or alleviated by anything in particular.  He has not had any fever, dysuria, hematuria, nausea, vomiting, or changes in bowel movements.  He denies any recent trauma to the area and has not been doing any heavy lifting.  He does state that the pain occasionally comes across into his abdomen diffusely.  He denies any similar symptoms in the past and has never had surgery on his abdomen.  He has not had any cough, chest pain, or shortness of breath.     Physical Exam   Triage Vital Signs: ED Triage Vitals  Enc Vitals Group     BP 03/12/21 0621 (!) 141/98     Pulse Rate 03/12/21 0621 86     Resp 03/12/21 0621 16     Temp 03/12/21 0621 97.9 F (36.6 C)     Temp Source 03/12/21 0621 Oral     SpO2 03/12/21 0621 100 %     Weight 03/12/21 0620 145 lb (65.8 kg)     Height 03/12/21 0620 5\' 6"  (1.676 m)     Head Circumference --      Peak Flow --      Pain Score 03/12/21 0626 3     Pain Loc --      Pain Edu? --      Excl. in [place]? --     Most recent vital signs: Vitals:   03/12/21 0621 03/12/21 0826  BP: (!) 141/98 (!) 134/91  Pulse: 86 77  Resp: 16 18  Temp: 97.9 F (36.6 C)   SpO2: 100% 99%    Constitutional: Alert and oriented. Eyes: Conjunctivae are normal. Head: Atraumatic. Nose: No congestion/rhinnorhea. Mouth/Throat: Mucous membranes are moist.  Cardiovascular: Normal rate, regular rhythm. Grossly normal heart sounds.  2+ radial pulses bilaterally. Respiratory:  Normal respiratory effort.  No retractions. Lungs CTAB. Gastrointestinal: Soft and diffusely tender to palpation with no rebound or guarding.  Right CVA tenderness noted. No distention. Musculoskeletal: No lower extremity tenderness nor edema.  Neurologic:  Normal speech and language. No gross focal neurologic deficits are appreciated.    ED Results / Procedures / Treatments   Labs (all labs ordered are listed, but only abnormal results are displayed) Labs Reviewed  COMPREHENSIVE METABOLIC PANEL - Abnormal; Notable for the following components:      Result Value   Glucose, Bld 119 (*)    All other components within normal limits  URINALYSIS, ROUTINE W REFLEX MICROSCOPIC - Abnormal; Notable for the following components:   Color, Urine YELLOW (*)    APPearance CLEAR (*)    Ketones, ur 5 (*)    All other components within normal limits  CBC  LIPASE, BLOOD    RADIOLOGY CT scan reviewed by me with no inflammatory changes or ureteral obstruction.  PROCEDURES:  Critical Care performed: No  Procedures   MEDICATIONS ORDERED IN ED: Medications  iohexol (OMNIPAQUE) 300 MG/ML solution 100 mL (100 mLs Intravenous Contrast Given  03/12/21 1403)     IMPRESSION / MDM / ASSESSMENT AND PLAN / ED COURSE  I reviewed the triage vital signs and the nursing notes.                              56 y.o. male with no significant past medical history presents to the ED complaining of right flank pain that has been increasing in frequency and severity for the past couple of months but also radiates around towards his abdomen.  Differential diagnosis includes, but is not limited to, kidney stone, pyelonephritis, cholecystitis, biliary colic, colitis, muscle strain.  Patient is well-appearing and in no acute distress, vital signs are reassuring.  He has tenderness across his right flank and extending into his abdomen diffusely and we will further assess with CT scan, he declines pain medication.   Labs are unremarkable, CBC without any anemia or leukocytosis, CMP without electrolyte abnormality, we will add on LFTs.  UA shows no signs of infection or hematuria, low suspicion for kidney stone at this time.  CT scan is negative for acute process and given unremarkable work-up, patient's symptoms seem most consistent with a muscle strain.  He is appropriate for outpatient management and was counseled to establish care with PCP, states he has an appointment in 2 weeks.  He was counseled to return to the ED for new worsening symptoms, patient agrees with plan.       FINAL CLINICAL IMPRESSION(S) / ED DIAGNOSES   Final diagnoses:  Flank pain  Muscle strain     Rx / DC Orders   ED Discharge Orders          Ordered    lidocaine (LIDODERM) 5 %  Every 12 hours        03/12/21 1528    cyclobenzaprine (FLEXERIL) 5 MG tablet  3 times daily PRN        03/12/21 1528             Note:  This document was prepared using Dragon voice recognition software and may include unintentional dictation errors.   Blake Divine, MD 03/12/21 [phone]

## 2021-03-23 ENCOUNTER — Encounter: Payer: Self-pay | Admitting: Internal Medicine

## 2021-03-23 ENCOUNTER — Ambulatory Visit: Payer: BC Managed Care – PPO | Admitting: Internal Medicine

## 2021-03-23 VITALS — BP 120/72 | HR 81 | Temp 97.8°F | Resp 16 | Ht 66.0 in | Wt 142.6 lb

## 2021-03-23 DIAGNOSIS — B009 Herpesviral infection, unspecified: Secondary | ICD-10-CM

## 2021-03-23 DIAGNOSIS — Z1211 Encounter for screening for malignant neoplasm of colon: Secondary | ICD-10-CM | POA: Diagnosis not present

## 2021-03-23 DIAGNOSIS — S39012A Strain of muscle, fascia and tendon of lower back, initial encounter: Secondary | ICD-10-CM

## 2021-03-23 DIAGNOSIS — Z23 Encounter for immunization: Secondary | ICD-10-CM

## 2021-03-23 DIAGNOSIS — J309 Allergic rhinitis, unspecified: Secondary | ICD-10-CM

## 2021-03-23 NOTE — Progress Notes (Signed)
New Patient Office Visit  Subjective:  Patient ID: Brendan Santos, male    DOB: 07/27/1965  Age: 56 y.o. MRN: [id number]  CC:  Chief Complaint  Patient presents with   Establish Care    Pt c/o back pain right side, pt states has been seen at the ER, still hurts till this day.    HPI Brendan Santos presents to establish care. Chronic medical conditions include allergic rhinitis, herpes simplex 1.   Complaining of right sided back pain that he went to the ER for on 03/12/21. UA in the ED negative, CT A/P negative as well, labs unremarkable.   BACK PAIN Duration: 12 weeks Mechanism of injury: unknown Location: Right flank  Onset: sudden Quality: burning, aching  Frequency: intermittent,  Radiation: none Aggravating factors: prolonged sitting Alleviating factors: nothing Status: better Treatments attempted: Lidocaine patch, Flexeril didn't help  Relief with NSAIDs?: No NSAIDs Taken Nighttime pain:  no Paresthesias / decreased sensation:  no Bowel / bladder incontinence:  no Fevers:   viral infection a few weeks ago but resolved  Dysuria / urinary frequency:  no  Allergic Rhinitis: -Currently on Flonase, Claritin occasionally   HSV: -Currently on Valtrex 500 daily  -No current outbreaks   Health Maintenance: -Blood work up to date with exception of lipid panel -Colon cancer screening: due  -Tdap due today   Past Medical History:  Diagnosis Date   Muscle twitch     History reviewed. No pertinent surgical history.  Family History  Problem Relation Age of Onset   Diabetes Mother    Heart disease Father    Cancer Father    Cancer Paternal Grandmother     Social History   Socioeconomic History   Marital status: Single    Spouse name: Not on file   Number of children: Not on file   Years of education: Not on file   Highest education level: Not on file  Occupational History   Not on file  Tobacco Use   Smoking status: Never   Smokeless tobacco:  Never  Vaping Use   Vaping Use: Never used  Substance and Sexual Activity   Alcohol use: Not Currently    Comment: Moderate; drinks a few beers during the week, more on the weekend.   Drug use: No    Comment: History of LSD use.   Sexual activity: Yes  Other Topics Concern   Not on file  Social History Narrative   Not on file   Social Determinants of Health   Financial Resource Strain: Not on file  Food Insecurity: Not on file  Transportation Needs: Not on file  Physical Activity: Not on file  Stress: Not on file  Social Connections: Not on file  Intimate Partner Violence: Not on file    ROS Review of Systems  Constitutional:  Negative for chills and fever.  Eyes:  Negative for visual disturbance.  Respiratory:  Negative for cough and shortness of breath.   Cardiovascular:  Negative for chest pain.  Gastrointestinal:  Negative for abdominal pain.  Genitourinary:  Positive for flank pain. Negative for dysuria, hematuria and urgency.  Musculoskeletal:  Positive for back pain. Negative for gait problem.  Skin: Negative.   Neurological:  Negative for dizziness and weakness.   Objective:   Today's Vitals: BP 120/72    Pulse 81    Temp 97.8 F (36.6 C) (Oral)    Resp 16    Ht 5\' 6"  (1.676 m)    Wt  142 lb 9.6 oz (64.7 kg)    SpO2 100%    BMI 23.02 kg/m   Physical Exam Constitutional:      Appearance: Normal appearance.  HENT:     Head: Normocephalic and atraumatic.  Eyes:     Conjunctiva/sclera: Conjunctivae normal.  Cardiovascular:     Rate and Rhythm: Normal rate and regular rhythm.  Pulmonary:     Effort: Pulmonary effort is normal.     Breath sounds: Normal breath sounds.  Musculoskeletal:        General: Tenderness present. Normal range of motion.     Right lower leg: No edema.     Left lower leg: No edema.     Comments: Bilateral lumbar paraspinal hypertonicity  Skin:    General: Skin is warm and dry.  Neurological:     General: No focal deficit present.      Mental Status: He is alert. Mental status is at baseline.  Psychiatric:        Mood and Affect: Mood normal.        Behavior: Behavior normal.    Assessment & Plan:  1. Strain of lumbar region, initial encounter: Discussed topical agents like BenGay, IcyHot, etc and NSAIDs as needed for back strain as well as gentle stretching, moist heat, etc.   2. HSV-1 (herpes simplex virus 1) infection: Stable, continue Valtrex.  3. Allergic rhinitis, unspecified seasonality, unspecified trigger: Stable, continue current meds.   4. Colon cancer screening: Cologuard ordered to colon cancer screening today.  - Cologuard  5. Need for Tdap vaccination: Tdap administered today.  - Tdap vaccine greater than or equal to 7yo IM  Follow-up: Return in about 1 year (around 03/23/2022).   Teodora Medici, DO

## 2021-03-23 NOTE — Patient Instructions (Signed)
It was great seeing you today!  Plan discussed at today's visit: -For back pain, can take Ibuprofen as needed, take with food -Cologuard ordered today -Tdap vaccine given today   Follow up in: 1 year  Take care and let us know if you have any questions or concerns prior to your next visit.  Dr. Caralee Ates

## 2021-07-21 ENCOUNTER — Encounter: Payer: Self-pay | Admitting: Internal Medicine

## 2021-07-21 ENCOUNTER — Ambulatory Visit: Payer: BC Managed Care – PPO | Admitting: Internal Medicine

## 2021-07-21 VITALS — BP 122/78 | HR 79 | Temp 97.8°F | Resp 16 | Ht 66.0 in | Wt 146.4 lb

## 2021-07-21 DIAGNOSIS — M25571 Pain in right ankle and joints of right foot: Secondary | ICD-10-CM | POA: Diagnosis not present

## 2021-07-21 NOTE — Progress Notes (Signed)
Acute Office Visit  Subjective:     Patient ID: Brendan Santos, male    DOB: [birth year], 56 y.o.   MRN: [id number]  Chief Complaint  Patient presents with   Gout    Right foot    HPI Patient is in today for possible gout.  Pain, swelling and erythema started started in his right big toe but it quickly spread to his entire foot and up his calf.  This started about 2 months ago.  He states today the pain and symptoms are much better.  At the most severe pain, he rated 10 out of 10, today it was a 2-3 out of 10.  He has never had gout or symptoms like this before.  He denies any other new joint pains or skin changes today.  Joint pain: Duration: 2 months Right 1st metatarsophalangeal pain: yes Left 1st metatarsophalangeal pain: no Right knee pain: yes Left knee pain: no Severity: 3/10  Quality: aching Swelling: yes Redness: yes Trauma: no; history of broken foot when 56 year old  Recent dietary change or indiscretion: no Fevers: no Nausea/vomiting:  had some nausea when the pain was at it's worst  Aggravating factors: walking  Alleviating factors: resting, elevation  Status:  better Treatments attempted: None   Review of Systems  Constitutional:  Negative for chills and fever.  Musculoskeletal:  Positive for joint pain.  Skin: Negative.        Objective:    BP 122/78   Pulse 79   Temp 97.8 F (36.6 C)   Resp 16   Ht 5\' 6"  (1.676 m)   Wt 146 lb 6.4 oz (66.4 kg)   SpO2 96%   BMI 23.63 kg/m  BP Readings from Last 3 Encounters:  07/21/21 122/78  03/23/21 120/72  03/12/21 (!) 134/91   Wt Readings from Last 3 Encounters:  07/21/21 146 lb 6.4 oz (66.4 kg)  03/23/21 142 lb 9.6 oz (64.7 kg)  03/12/21 145 lb (65.8 kg)      Physical Exam Constitutional:      Appearance: Normal appearance.  HENT:     Head: Normocephalic and atraumatic.  Eyes:     Conjunctiva/sclera: Conjunctivae normal.  Cardiovascular:     Rate and Rhythm: Normal rate and regular  rhythm.  Pulmonary:     Effort: Pulmonary effort is normal.     Breath sounds: Normal breath sounds.  Musculoskeletal:        General: Tenderness present. No swelling. Normal range of motion.     Comments: Mild right MTP pain to palpation, no erythema or swelling noted on exam, right ankle and foot with good range of motion and sensation.  Skin:    General: Skin is warm and dry.     Findings: No rash.  Neurological:     General: No focal deficit present.     Mental Status: He is alert. Mental status is at baseline.  Psychiatric:        Mood and Affect: Mood normal.        Behavior: Behavior normal.    No results found for any visits on 07/21/21.      Assessment & Plan:   1. Arthralgia of right foot: Pain resolving now.  Discussed etiology and causes of gout and how it typically presents.  Discussed foods that can sometimes trigger gout.  Also discussed gout flares with anti-inflammatories or colchicine.  Recommend he start taking ibuprofen as needed for his current pain.  In the future if  he has another flare, recommend ibuprofen 600 to 800 mg every 6 hours as needed.  We will go ahead and check a uric acid level now that the flare is complete.  He will return next week for his annual physical and get the blood work done then.  - Uric acid  Return for CPE.  Margarita Mail, DO

## 2021-07-21 NOTE — Patient Instructions (Addendum)
Plan to check uric acid in the blood at annual visit Can take Ibuprofen 600-800 mg every 6hours for flares  Gout  Gout is a condition that causes painful swelling of the joints. Gout is a type of inflammation of the joints (arthritis). This condition is caused by having too much uric acid in the body. Uric acid is a chemical that forms when the body breaks down substances called purines. Purines are important for building body proteins. When the body has too much uric acid, sharp crystals can form and build up inside the joints. This causes pain and swelling. Gout attacks can happen quickly and may be very painful (acute gout). Over time, the attacks can affect more joints and become more frequent (chronic gout). Gout can also cause uric acid to build up under the skin and inside the kidneys. What are the causes? This condition is caused by too much uric acid in your blood. This can happen because: Your kidneys do not remove enough uric acid from your blood. This is the most common cause. Your body makes too much uric acid. This can happen with some cancers and cancer treatments. It can also occur if your body is breaking down too many red blood cells (hemolytic anemia). You eat too many foods that are high in purines. These foods include organ meats and some seafood. Alcohol, especially beer, is also high in purines. A gout attack may be triggered by trauma or stress. What increases the risk? The following factors may make you more likely to develop this condition: Having a family history of gout. Being male and middle-aged. Being male and having gone through menopause. Taking certain medicines, including aspirin, cyclosporine, diuretics, levodopa, and niacin. Having an organ transplant. Having certain conditions, such as: Being obese. Lead poisoning. Kidney disease. A skin condition called psoriasis. Other factors include: Losing weight too quickly. Being dehydrated. Frequently  drinking alcohol, especially beer. Frequently drinking beverages that are sweetened with a type of sugar called fructose. What are the signs or symptoms? An attack of acute gout happens quickly. It usually occurs in just one joint. The most common place is the big toe. Attacks often start at night. Other joints that may be affected include joints of the feet, ankle, knee, fingers, wrist, or elbow. Symptoms of this condition may include: Severe pain. Warmth. Swelling. Stiffness. Tenderness. The affected joint may be very painful to touch. Shiny, red, or purple skin. Chills and fever. Chronic gout may cause symptoms more frequently. More joints may be involved. You may also have white or yellow lumps (tophi) on your hands or feet or in other areas near your joints. How is this diagnosed? This condition is diagnosed based on your symptoms, your medical history, and a physical exam. You may have tests, such as: Blood tests to measure uric acid levels. Removal of joint fluid with a thin needle (aspiration) to look for uric acid crystals. X-rays to look for joint damage. How is this treated? Treatment for this condition has two phases: treating an acute attack and preventing future attacks. Acute gout treatment may include medicines to reduce pain and swelling, including: NSAIDs, such as ibuprofen. Steroids. These are strong anti-inflammatory medicines that can be taken by mouth (orally) or injected into a joint. Colchicine. This medicine relieves pain and swelling when it is taken soon after an attack. It can be given by mouth or through an IV. Preventive treatment may include: Daily use of smaller doses of NSAIDs or colchicine. Use of a  medicine that reduces uric acid levels in your blood, such as allopurinol. Changes to your diet. You may need to see a dietitian about what to eat and drink to prevent gout. Follow these instructions at home: During a gout attack  If directed, put ice on the  affected area. To do this: Put ice in a plastic bag. Place a towel between your skin and the bag. Leave the ice on for 20 minutes, 2-3 times a day. Remove the ice if your skin turns bright red. This is very important. If you cannot feel pain, heat, or cold, you have a greater risk of damage to the area. Raise (elevate) the affected joint above the level of your heart as often as possible. Rest the joint as much as possible. If the affected joint is in your leg, you may be given crutches to use. Follow instructions from your health care provider about eating or drinking restrictions. Avoiding future gout attacks Follow a low-purine diet as told by your dietitian or health care provider. Avoid foods and drinks that are high in purines, including liver, kidney, anchovies, asparagus, herring, mushrooms, mussels, and beer. Maintain a healthy weight or lose weight if you are overweight. If you want to lose weight, talk with your health care provider. Do not lose weight too quickly. Start or maintain an exercise program as told by your health care provider. Eating and drinking Avoid drinking beverages that contain fructose. Drink enough fluids to keep your urine pale yellow. If you drink alcohol: Limit how much you have to: 0-1 drink a day for women who are not pregnant. 0-2 drinks a day for men. Know how much alcohol is in a drink. In the [place] one drink equals one 12 oz bottle of beer (355 mL), one 5 oz glass of wine (148 mL), or one 1 oz glass of hard liquor (44 mL). General instructions Take over-the-counter and prescription medicines only as told by your health care provider. Ask your health care provider if the medicine prescribed to you requires you to avoid driving or using machinery. Return to your normal activities as told by your health care provider. Ask your health care provider what activities are safe for you. Keep all follow-up visits. This is important. Where to find more  information [employer]: www.niams.http://www.myers.net/ Contact a health care provider if you have: Another gout attack. Continuing symptoms of a gout attack after 10 days of treatment. Side effects from your medicines. Chills or a fever. Burning pain when you urinate. Pain in your lower back or abdomen. Get help right away if you: Have severe or uncontrolled pain. Cannot urinate. Summary Gout is painful swelling of the joints caused by having too much uric acid in the body. The most common site for gout to occur is in the big toe, but it can affect other joints in the body. Medicines and dietary changes can help to prevent and treat gout attacks. This information is not intended to replace advice given to you by your health care provider. Make sure you discuss any questions you have with your health care provider. Document Revised: 11/10/2020 Document Reviewed: 11/10/2020 Elsevier Patient Education  2023 [employer]  Low-Purine Eating Plan A low-purine eating plan involves making food choices to limit your purine intake. Purine is a kind of uric acid. Too much uric acid in your blood can cause certain conditions, such as gout and kidney stones. Eating a low-purine diet may help control these conditions. What are tips  for following this plan? Shopping Avoid buying products that contain high-fructose corn syrup. Check for this on food labels. It is commonly found in many processed foods and soft drinks. Be sure to check for it in baked goods such as cookies, canned fruits, and cereals and cereal bars. Avoid buying veal, chicken breast with skin, lamb, and organ meats such as liver. These types of meats tend to have the highest purine content. Choose dairy products. These may lower uric acid levels. Avoid certain types of fish. Not all fish and seafood have high purine content. Examples with high purine content include anchovies, trout, tuna, sardines, and salmon. Avoid buying  beverages that contain alcohol, particularly beer and hard liquor. Alcohol can affect the way your body gets rid of uric acid. Meal planning  Learn which foods do or do not affect you. If you find out that a food tends to cause your gout symptoms to flare up, avoid eating that food. You can enjoy foods that do not cause problems. If you have any questions about a food item, talk with your dietitian or health care provider. Reduce the overall amount of meat in your diet. When you do eat meat, choose ones with lower purine content. Include plenty of fruits and vegetables. Although some vegetables may have a high purine content--such as asparagus, mushrooms, spinach, or cauliflower--it has been shown that these do not contribute to uric acid blood levels as much. Consume at least 1 dairy serving a day. This has been shown to decrease uric acid levels. General information If you drink alcohol: Limit how much you have to: 0-1 drink a day for women who are not pregnant. 0-2 drinks a day for men. Know how much alcohol is in a drink. In the [place] one drink equals one 12 oz bottle of beer (355 mL), one 5 oz glass of wine (148 mL), or one 1 oz glass of hard liquor (44 mL). Drink plenty of water. Try to drink enough to keep your urine pale yellow. Fluids can help remove uric acid from your body. Work with your health care provider and dietitian to develop a plan to achieve or maintain a healthy weight. Losing weight may help reduce uric acid in your blood. What foods are recommended? The following are some types of foods that are good choices when limiting purine intake: Fresh or frozen fruits and vegetables. Whole grains, breads, cereals, and pasta. Rice. Beans, peas, legumes. Nuts and seeds. Dairy products. Fats and oils. The items listed above may not be a complete list. Talk with a dietitian about what dietary choices are best for you. What foods are not recommended? Limit your intake of foods  high in purines, including: Beer and other alcohol. Meat-based gravy or sauce. Canned or fresh fish, such as: Anchovies, sardines, herring, salmon, and tuna. Mussels and scallops. Codfish, trout, and haddock. Bacon, veal, chicken breast with skin, and lamb. Organ meats, such as: Liver or kidney. Tripe. Sweetbreads (thymus gland or pancreas). Wild Education officer, environmental. Yeast or yeast extract supplements. Drinks sweetened with high-fructose corn syrup, such as soda. Processed foods made with high-fructose corn syrup. The items listed above may not be a complete list of foods and beverages you should limit. Contact a dietitian for more information. Summary Eating a low-purine diet may help control conditions caused by too much uric acid in the body, such as gout or kidney stones. Choose low-purine foods, limit alcohol, and limit high-fructose corn syrup. You will learn over time which  foods do or do not affect you. If you find out that a food tends to cause your gout symptoms to flare up, avoid eating that food. This information is not intended to replace advice given to you by your health care provider. Make sure you discuss any questions you have with your health care provider. Document Revised: 01/20/2021 Document Reviewed: 01/20/2021 Elsevier Patient Education  2023 [employer].

## 2021-08-01 ENCOUNTER — Encounter: Payer: BC Managed Care – PPO | Admitting: Internal Medicine

## 2021-08-01 ENCOUNTER — Ambulatory Visit: Payer: BC Managed Care – PPO | Admitting: Internal Medicine

## 2021-08-01 NOTE — Progress Notes (Deleted)
Name: Brendan Santos   MRN: [id number]    DOB: [birth year]   Date:08/01/2021       Progress Note  Subjective  Chief Complaint  No chief complaint on file.   HPI  Patient presents for annual CPE ***.  IPSS Questionnaire (AUA-7): Over the past month.   1)  How often have you had a sensation of not emptying your bladder completely after you finish urinating?  {Rating:19227}  2)  How often have you had to urinate again less than two hours after you finished urinating? {Rating:19227}  3)  How often have you found you stopped and started again several times when you urinated?  {Rating:19227}  4) How difficult have you found it to postpone urination?  {Rating:19227}  5) How often have you had a weak urinary stream?  {Rating:19227}  6) How often have you had to push or strain to begin urination?  {Rating:19227}  7) How many times did you most typically get up to urinate from the time you went to bed until the time you got up in the morning?  {Rating:19228}  Total score:  0-7 mildly symptomatic   8-19 moderately symptomatic   20-35 severely symptomatic     Diet: *** Exercise: ***  Depression: phq 9 is {gen pos neg:315643}    07/21/2021   11:05 AM 03/23/2021   10:44 AM  Depression screen PHQ 2/9  Decreased Interest 0 0  Down, Depressed, Hopeless 0 0  PHQ - 2 Score 0 0  Altered sleeping 0 0  Tired, decreased energy 0 0  Change in appetite 0 0  Feeling bad or failure about yourself  0 0  Trouble concentrating 0 0  Moving slowly or fidgety/restless 0 0  Suicidal thoughts 0 0  PHQ-9 Score 0 0  Difficult doing work/chores Not difficult at all Not difficult at all    Hypertension:  BP Readings from Last 3 Encounters:  07/21/21 122/78  03/23/21 120/72  03/12/21 (!) 134/91    Obesity: Wt Readings from Last 3 Encounters:  07/21/21 146 lb 6.4 oz (66.4 kg)  03/23/21 142 lb 9.6 oz (64.7 kg)  03/12/21 145 lb (65.8 kg)   BMI Readings from Last 3 Encounters:  07/21/21 23.63 kg/m   03/23/21 23.02 kg/m  03/12/21 23.40 kg/m     Lipids:  Lab Results  Component Value Date   CHOL 291 (H) 10/04/2016   Lab Results  Component Value Date   HDL 67 10/04/2016   Lab Results  Component Value Date   LDLCALC 205 (H) 10/04/2016   Lab Results  Component Value Date   TRIG 94 10/04/2016   Lab Results  Component Value Date   CHOLHDL 4.3 10/04/2016   No results found for: "LDLDIRECT" Glucose:  Glucose  Date Value Ref Range Status  10/04/2016 103 (H) 65 - 99 mg/dL Final   Glucose, Bld  Date Value Ref Range Status  03/12/2021 119 (H) 70 - 99 mg/dL Final    Comment:    Glucose reference range applies only to samples taken after fasting for at least 8 hours.  05/30/2016 207 (H) 65 - 99 mg/dL Final     ***  Single STD testing and prevention (HIV/chl/gon/syphilis):  {yes/no/default [id number] applicable"} Sexual history:  Hep C Screening:  Skin cancer: Discussed monitoring for atypical lesions Colorectal cancer: *** Prostate cancer:  {yes/no/default [id number] applicable"} No results found for: "PSA"   Lung cancer:  Low Dose CT Chest recommended if Age 16-80 years, 30 pack-year currently  smoking OR have quit w/in 15years. Patient  {Response; is/is not/na:63} a candidate for screening   AAA: The USPSTF recommends one-time screening with ultrasonography in men ages 48 to 65 years who have ever smoked. Patient   {Response; is /is not/na:63}, a candidate for screening  ECG:  ***  Vaccines:   HPV: n/a Tdap: 03/2021 Shingrix: due  Pneumonia: n/a Flu: ? COVID-19: 2 vaccines in 2021  Advanced Care Planning: A voluntary discussion about advance care planning including the explanation and discussion of advance directives.  Discussed health care proxy and Living will, and the patient was able to identify a health care proxy as ***.  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  Family History  Problem Relation Age of Onset   Diabetes Mother    Heart disease Father    Cancer Father    Cancer Paternal Grandmother     Social History   Socioeconomic History   Marital status: Single    Spouse name: Not on file   Number of children: Not on file   Years of education: Not on file   Highest education level: Not on file  Occupational History   Not on file  Tobacco Use   Smoking status: Never   Smokeless tobacco: Never  Vaping Use   Vaping Use: Never used  Substance and Sexual Activity   Alcohol use: Not Currently    Comment: Moderate; drinks a few beers during the week, more on the weekend.   Drug use: No    Comment: History of LSD use.   Sexual activity: Yes  Other Topics Concern   Not on file  Social History Narrative   Not on file   Social Determinants of Health   Financial Resource Strain: Not on file  Food Insecurity: Not on file  Transportation Needs: Not on file  Physical Activity: Not on file  Stress: Not on file  Social Connections: Not on file  Intimate Partner Violence: Not on file     Current Outpatient Medications:    fluticasone (FLONASE) 50 MCG/ACT nasal spray, Place 2 sprays into both nostrils daily., Disp: 16 g, Rfl: 3   ibuprofen (ADVIL) 400 MG tablet, Take by mouth., Disp: , Rfl:    loratadine (CLARITIN) 10 MG tablet, Take by mouth., Disp: , Rfl:    ofloxacin (OCUFLOX) 0.3 % ophthalmic solution, ofloxacin 0.3 % eye drops, Disp: , Rfl:    valACYclovir (VALTREX) 500 MG tablet, Take 500 mg by mouth daily., Disp: , Rfl:   Allergies  Allergen Reactions   No Known Allergies      ROS  ***   Objective  There were no vitals filed for this visit.  There is no height or weight on file to calculate BMI.  Physical Exam ***  No results found for this or any previous  visit (from the past 2160 hour(s)).   Fall Risk:    07/21/2021   11:02 AM 03/23/2021   10:44 AM  Fall Risk   Falls in the past year? 0 0  Number falls in past yr: 0 0  Injury with Fall? 0 0  Risk for fall due to :  No Fall Risks  Follow up  Falls prevention discussed     Functional Status  Survey:      Assessment & Plan  There are no diagnoses linked to this encounter.   -Prostate cancer screening and PSA options (with potential risks and benefits of testing vs not testing) were discussed along with recent recs/guidelines. -USPSTF grade A and B recommendations reviewed with patient; age-appropriate recommendations, preventive care, screening tests, etc discussed and encouraged; healthy living encouraged; see AVS for patient education given to patient -Discussed importance of 150 minutes of physical activity weekly, eat two servings of fish weekly, eat one serving of tree nuts ( cashews, pistachios, pecans, almonds.Marland Kitchen) every other day, eat 6 servings of fruit/vegetables daily and drink plenty of water and avoid sweet beverages.  -Reviewed Health Maintenance: {yes/no/default [id number] applicable"}

## 2021-08-22 ENCOUNTER — Other Ambulatory Visit (HOSPITAL_COMMUNITY)
Admission: RE | Admit: 2021-08-22 | Discharge: 2021-08-22 | Disposition: A | Discharge status: Home / Self Care | Payer: BC Managed Care – PPO | Source: Ambulatory Visit | Attending: Internal Medicine | Admitting: Internal Medicine

## 2021-08-22 ENCOUNTER — Encounter: Payer: Self-pay | Admitting: Internal Medicine

## 2021-08-22 ENCOUNTER — Ambulatory Visit (INDEPENDENT_AMBULATORY_CARE_PROVIDER_SITE_OTHER): Payer: BC Managed Care – PPO | Admitting: Internal Medicine

## 2021-08-22 VITALS — BP 124/84 | HR 81 | Temp 97.8°F | Resp 16 | Ht 66.0 in | Wt 147.1 lb

## 2021-08-22 DIAGNOSIS — Z1211 Encounter for screening for malignant neoplasm of colon: Secondary | ICD-10-CM | POA: Diagnosis not present

## 2021-08-22 DIAGNOSIS — Z113 Encounter for screening for infections with a predominantly sexual mode of transmission: Secondary | ICD-10-CM | POA: Diagnosis present

## 2021-08-22 DIAGNOSIS — H6092 Unspecified otitis externa, left ear: Secondary | ICD-10-CM

## 2021-08-22 DIAGNOSIS — R5383 Other fatigue: Secondary | ICD-10-CM

## 2021-08-22 DIAGNOSIS — Z0000 Encounter for general adult medical examination without abnormal findings: Secondary | ICD-10-CM | POA: Diagnosis not present

## 2021-08-22 DIAGNOSIS — H9312 Tinnitus, left ear: Secondary | ICD-10-CM

## 2021-08-22 MED ORDER — NEOMYCIN-POLYMYXIN-HC 3.5-10000-1 OT SOLN: 3.0000 [drp] | Freq: Four times a day (QID) | OTIC | 0 refills | Status: DC

## 2021-08-22 NOTE — Progress Notes (Signed)
Name: Brendan Santos   MRN: [id number]    DOB: 1966-02-09   Date:08/22/2021       Progress Note  Subjective  Chief Complaint  Chief Complaint  Patient presents with   Annual Exam    HPI  Patient presents for annual CPE.  Has some left ear pain and chronic tinnitus. Denies fevers. States pain radiates into his neck and jaw. Does have some hearing changes in the left ear. Denies drainage from the ear. Uses a wax earplug while in the shower to avoid water getting into the ear. Does have a history of otitis externa in this ear previously. Does also have history of eustachian tube dysfunction in the left ear, uses nasal saline and nasal steroid. Has seen ENT for this issue previously but it's been about 2 years.   IPSS Questionnaire (AUA-7): Over the past month.   1)  How often have you had a sensation of not emptying your bladder completely after you finish urinating?  0 - Not at all  2)  How often have you had to urinate again less than two hours after you finished urinating? 0 - Not at all  3)  How often have you found you stopped and started again several times when you urinated?  1 - Less than 1 time in 5  4) How difficult have you found it to postpone urination?  0 - Not at all  5) How often have you had a weak urinary stream?  0 - Not at all  6) How often have you had to push or strain to begin urination?  0 - Not at all  7) How many times did you most typically get up to urinate from the time you went to bed until the time you got up in the morning?  0 - None  Total score:  0-7 mildly symptomatic   8-19 moderately symptomatic   20-35 severely symptomatic    Diet: avoid red meats and pork - eats out 2-3 times a week  Exercise: No  Depression: phq 9 is negative    08/22/2021    2:20 PM 07/21/2021   11:05 AM 03/23/2021   10:44 AM  Depression screen PHQ 2/9  Decreased Interest 0 0 0  Down, Depressed, Hopeless 0 0 0  PHQ - 2 Score 0 0 0  Altered sleeping 0 0 0  Tired, decreased  energy 0 0 0  Change in appetite 0 0 0  Feeling bad or failure about yourself  0 0 0  Trouble concentrating 0 0 0  Moving slowly or fidgety/restless 0 0 0  Suicidal thoughts 0 0 0  PHQ-9 Score 0 0 0  Difficult doing work/chores Not difficult at all Not difficult at all Not difficult at all    Hypertension:  BP Readings from Last 3 Encounters:  08/22/21 124/84  07/21/21 122/78  03/23/21 120/72    Obesity: Wt Readings from Last 3 Encounters:  08/22/21 147 lb 1.6 oz (66.7 kg)  07/21/21 146 lb 6.4 oz (66.4 kg)  03/23/21 142 lb 9.6 oz (64.7 kg)   BMI Readings from Last 3 Encounters:  08/22/21 23.74 kg/m  07/21/21 23.63 kg/m  03/23/21 23.02 kg/m     Lipids:  Lab Results  Component Value Date   CHOL 291 (H) 10/04/2016   Lab Results  Component Value Date   HDL 67 10/04/2016   Lab Results  Component Value Date   LDLCALC 205 (H) 10/04/2016   Lab Results  Component  Value Date   TRIG 94 10/04/2016   Lab Results  Component Value Date   CHOLHDL 4.3 10/04/2016   No results found for: "LDLDIRECT" Glucose:  Glucose  Date Value Ref Range Status  10/04/2016 103 (H) 65 - 99 mg/dL Final   Glucose, Bld  Date Value Ref Range Status  03/12/2021 119 (H) 70 - 99 mg/dL Final    Comment:    Glucose reference range applies only to samples taken after fasting for at least 8 hours.  05/30/2016 207 (H) 65 - 99 mg/dL Final     Single STD testing and prevention (HIV/chl/gon/syphilis):  yes Hep C Screening: Due Skin cancer: Discussed monitoring for atypical lesions Colorectal cancer: Cologuard due Prostate cancer:  yes No results found for: "PSA"  Lung cancer:  Low Dose CT Chest recommended if Age 45-80 years, 30 pack-year currently smoking OR have quit w/in 15years. Patient  not applicable a candidate for screening   AAA: The USPSTF recommends one-time screening with ultrasonography in men ages 71 to 75 years who have ever smoked. 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 No pertinent surgical history.  Family History  Problem Relation Age of Onset   Diabetes Mother    Heart disease Father    Cancer Father    Cancer Paternal Grandmother     Social History   Socioeconomic History   Marital status: Single    Spouse name: Not on file   Number of children: Not on file   Years of education: Not on file   Highest education level: Not on file  Occupational History   Not on file  Tobacco Use   Smoking status: Never   Smokeless tobacco: Never  Vaping Use   Vaping Use: Never used  Substance and Sexual Activity   Alcohol use: Not Currently    Comment: Moderate; drinks a few beers during the week, more on the weekend.   Drug use: No    Comment: History of LSD use.   Sexual activity: Yes  Other Topics Concern   Not on file  Social History Narrative   Not on file   Social Determinants of Health   Financial Resource Strain: Low Risk  (08/22/2021)   Overall Financial Resource Strain (CARDIA)    Difficulty of Paying Living Expenses: Not hard at all  Food Insecurity: No Food Insecurity (08/22/2021)   Hunger Vital Sign    Worried About Running Out of Food in the Last Year: Never true    Ran Out of Food in the Last Year: Never true  Transportation Needs: Unknown (08/22/2021)   PRAPARE - Administrator, Civil Service (Medical): No    Lack of Transportation (Non-Medical): Not on file  Physical Activity: Inactive (08/22/2021)   Exercise Vital Sign    Days of Exercise per Week: 0 days    Minutes of Exercise per Session: 0 min  Stress: Stress Concern Present (08/22/2021)   [employer] of  Occupational Health - Occupational Stress Questionnaire    Feeling of Stress : To some extent  Social Connections: Socially Isolated (08/22/2021)   Social Connection and Isolation Panel [  NHANES]    Frequency of Communication with Friends and Family: Twice a week    Frequency of Social Gatherings with Friends and Family: Twice a week    Attends Religious Services: Never    Database administrator or Organizations: No    Attends Banker Meetings: Never    Marital Status: Never married  Intimate Partner Violence: Not At Risk (08/22/2021)   Humiliation, Afraid, Rape, and Kick questionnaire    Fear of Current or Ex-Partner: No    Emotionally Abused: No    Physically Abused: No    Sexually Abused: No     Current Outpatient Medications:    fluticasone (FLONASE) 50 MCG/ACT nasal spray, Place 2 sprays into both nostrils daily., Disp: 16 g, Rfl: 3   ibuprofen (ADVIL) 400 MG tablet, Take by mouth., Disp: , Rfl:    loratadine (CLARITIN) 10 MG tablet, Take by mouth., Disp: , Rfl:    neomycin-polymyxin-hydrocortisone (CORTISPORIN) OTIC solution, Place 3 drops into the left ear 4 (four) times daily., Disp: 10 mL, Rfl: 0   valACYclovir (VALTREX) 500 MG tablet, Take 500 mg by mouth daily., Disp: , Rfl:   Allergies  Allergen Reactions   No Known Allergies      Review of Systems  Constitutional:  Positive for malaise/fatigue. Negative for chills and fever.  HENT:  Positive for congestion, ear pain, hearing loss and tinnitus. Negative for ear discharge.    Objective  Vitals:   08/22/21 1409  BP: 124/84  Pulse: 81  Resp: 16  Temp: 97.8 F (36.6 C)  SpO2: 97%  Weight: 147 lb 1.6 oz (66.7 kg)  Height: 5\' 6"  (1.676 m)    Body mass index is 23.74 kg/m.  Physical Exam Constitutional:      Appearance: Normal appearance.  HENT:     Head: Normocephalic and atraumatic.     Right Ear: Tympanic membrane, ear canal and external ear normal.     Left Ear: Tympanic membrane and  external ear normal.     Ears:     Comments: Ear canal mildly swollen but erythematous and painful     Mouth/Throat:     Mouth: Mucous membranes are moist.     Pharynx: Oropharynx is clear.  Eyes:     Conjunctiva/sclera: Conjunctivae normal.  Cardiovascular:     Rate and Rhythm: Normal rate and regular rhythm.  Pulmonary:     Effort: Pulmonary effort is normal.     Breath sounds: Normal breath sounds.  Musculoskeletal:     Right lower leg: No edema.     Left lower leg: No edema.  Skin:    General: Skin is warm and dry.  Neurological:     General: No focal deficit present.     Mental Status: He is alert. Mental status is at baseline.  Psychiatric:        Mood and Affect: Mood normal.        Behavior: Behavior normal.     No results found for this or any previous visit (from the past 2160 hour(s)).   Fall Risk:    08/22/2021    2:20 PM 07/21/2021   11:02 AM 03/23/2021   10:44 AM  Fall Risk   Falls in the past year? 0 0 0  Number falls in past yr: 0 0 0  Injury with Fall? 0 0 0  Risk for fall due to :   No Fall Risks  Follow up   Falls prevention discussed  Assessment & Plan  1. Encounter for annual general medical examination without abnormal findings in adult/Fatigue, unspecified type: Screening labs ordered at our last visit, will obtain them today along with TSH and testosterone due to fatigue.   - TSH - Testosterone  2. Otitis externa of left ear, unspecified chronicity, unspecified type: Ear drops sent to pharmacy.   - neomycin-polymyxin-hydrocortisone (CORTISPORIN) OTIC solution; Place 3 drops into the left ear 4 (four) times daily.  Dispense: 10 mL; Refill: 0  3. Tinnitus of left ear: Chronic, unilateral. Had seen ENT in the past, will place a new referral since it's been 2 years.   - Ambulatory referral to ENT  4. Screening examination for STD (sexually transmitted disease): STD screening today.   - HIV antibody (with reflex) - RPR - Urine cytology  ancillary only  5. Colon cancer screening: Cologuard expired, new order placed today.   - Cologuard   -Prostate cancer screening and PSA options (with potential risks and benefits of testing vs not testing) were discussed along with recent recs/guidelines. -USPSTF grade A and B recommendations reviewed with patient; age-appropriate recommendations, preventive care, screening tests, etc discussed and encouraged; healthy living encouraged; see AVS for patient education given to patient -Discussed importance of 150 minutes of physical activity weekly, eat two servings of fish weekly, eat one serving of tree nuts ( cashews, pistachios, pecans, almonds.Marland Kitchen) every other day, eat 6 servings of fruit/vegetables daily and drink plenty of water and avoid sweet beverages.  -Reviewed Health Maintenance: yes

## 2021-08-22 NOTE — Patient Instructions (Addendum)
It was great seeing you today!  Plan discussed at today's visit: -Blood work ordered today, results will be uploaded to MyChart.  -Ear drop sent to pharmacy   Follow up in: 1 year   Take care and let us know if you have any questions or concerns prior to your next visit.  Dr. Caralee Ates  Recombinant Zoster (Shingles) Vaccine: What You Need to Know 1. Why get vaccinated? Recombinant zoster (shingles) vaccine can prevent shingles. Shingles (also called herpes zoster, or just zoster) is a painful skin rash, usually with blisters. In addition to the rash, shingles can cause fever, headache, chills, or upset stomach. Rarely, shingles can lead to complications such as pneumonia, hearing problems, blindness, brain inflammation (encephalitis), or death. The risk of shingles increases with age. The most common complication of shingles is long-term nerve pain called postherpetic neuralgia (PHN). PHN occurs in the areas where the shingles rash was and can last for months or years after the rash goes away. The pain from PHN can be severe and debilitating. The risk of PHN increases with age. An older adult with shingles is more likely to develop PHN and have longer lasting and more severe pain than a younger person. People with weakened immune systems also have a higher risk of getting shingles and complications from the disease. Shingles is caused by varicella-zoster virus, the same virus that causes chickenpox. After you have chickenpox, the virus stays in your body and can cause shingles later in life. Shingles cannot be passed from one person to another, but the virus that causes shingles can spread and cause chickenpox in someone who has never had chickenpox or has never received chickenpox vaccine. 2. Recombinant shingles vaccine Recombinant shingles vaccine provides strong protection against shingles. By preventing shingles, recombinant shingles vaccine also protects against PHN and other  complications. Recombinant shingles vaccine is recommended for: Adults 50 years and older Adults 19 years and older who have a weakened immune system because of disease or treatments Shingles vaccine is given as a two-dose series. For most people, the second dose should be given 2 to 6 months after the first dose. Some people who have or will have a weakened immune system can get the second dose 1 to 2 months after the first dose. Ask your health care provider for guidance. People who have had shingles in the past and people who have received varicella (chickenpox) vaccine are recommended to get recombinant shingles vaccine. The vaccine is also recommended for people who have already gotten another type of shingles vaccine, the live shingles vaccine. There is no live virus in recombinant shingles vaccine. Shingles vaccine may be given at the same time as other vaccines. 3. Talk with your health care provider Tell your vaccination provider if the person getting the vaccine: Has had an allergic reaction after a previous dose of recombinant shingles vaccine, or has any severe, life-threatening allergies Is currently experiencing an episode of shingles Is pregnant In some cases, your health care provider may decide to postpone shingles vaccination until a future visit. People with minor illnesses, such as a cold, may be vaccinated. People who are moderately or severely ill should usually wait until they recover before getting recombinant shingles vaccine. Your health care provider can give you more information. 4. Risks of a vaccine reaction A sore arm with mild or moderate pain is very common after recombinant shingles vaccine. Redness and swelling can also happen at the site of the injection. Tiredness, muscle pain, headache, shivering, fever,  stomach pain, and nausea are common after recombinant shingles vaccine. These side effects may temporarily prevent a vaccinated person from doing regular  activities. Symptoms usually go away on their own in 2 to 3 days. You should still get the second dose of recombinant shingles vaccine even if you had one of these reactions after the first dose. Guillain-Barr syndrome (GBS), a serious nervous system disorder, has been reported very rarely after recombinant zoster vaccine. People sometimes faint after medical procedures, including vaccination. Tell your provider if you feel dizzy or have vision changes or ringing in the ears. As with any medicine, there is a very remote chance of a vaccine causing a severe allergic reaction, other serious injury, or death. 5. What if there is a serious problem? An allergic reaction could occur after the vaccinated person leaves the clinic. If you see signs of a severe allergic reaction (hives, swelling of the face and throat, difficulty breathing, a fast heartbeat, dizziness, or weakness), call 9-1-1 and get the person to the nearest hospital. For other signs that concern you, call your health care provider. Adverse reactions should be reported to the Vaccine Adverse Event Reporting System (VAERS). Your health care provider will usually file this report, or you can do it yourself. Visit the VAERS website at www.vaers.LAgents.no or call 825-827-3871. VAERS is only for reporting reactions, and VAERS staff members do not give medical advice. 6. How can I learn more? Ask your health care provider. Call your local or state health department. Visit the website of the Food and Drug Administration (FDA) for vaccine package inserts and additional information at GoldCloset.com.ee. Contact the Centers for Disease Control and Prevention (CDC): Call 234-057-4482 (1-800-CDC-INFO) or Visit CDC's website at PicCapture.uy. Source: CDC Vaccine Information Statement Recombinant Zoster Vaccine (03/26/2020) This same material is available at FootballExhibition.com.br for no charge. This information is not intended  to replace advice given to you by your health care provider. Make sure you discuss any questions you have with your health care provider. Document Revised: 01/05/2021 Document Reviewed: 04/09/2020 Elsevier Patient Education  2023 [employer].

## 2021-08-24 LAB — URINE CYTOLOGY ANCILLARY ONLY
Chlamydia: NEGATIVE
Comment: NEGATIVE
Comment: NORMAL
Neisseria Gonorrhea: NEGATIVE

## 2021-08-25 LAB — HIV ANTIBODY (ROUTINE TESTING W REFLEX): HIV 1&2 Ab, 4th Generation: NONREACTIVE

## 2021-08-25 LAB — URIC ACID: Uric Acid, Serum: 7.6 mg/dL (ref 4.0–8.0)

## 2021-08-25 LAB — LIPID PANEL
Cholesterol: 273 mg/dL — ABNORMAL HIGH (ref <200)
HDL: 78 mg/dL (ref >=40)
LDL Cholesterol (Calc): 170 mg/dL (calc) — ABNORMAL HIGH
Non-HDL Cholesterol (Calc): 195 mg/dL (calc) — ABNORMAL HIGH (ref <130)
Total CHOL/HDL Ratio: 3.5 (calc) (ref <5.0)
Triglycerides: 120 mg/dL (ref <150)

## 2021-08-25 LAB — TSH: TSH: 2.61 mIU/L (ref 0.40–4.50)

## 2021-08-25 LAB — RPR: RPR Ser Ql: NONREACTIVE

## 2021-08-25 LAB — PSA: PSA: 0.64 ng/mL (ref <=4.00)

## 2021-08-25 LAB — HEPATITIS C ANTIBODY: Hepatitis C Ab: NONREACTIVE

## 2021-08-25 LAB — TESTOSTERONE: Testosterone: 353 ng/dL (ref 250–827)

## 2022-04-13 NOTE — Progress Notes (Signed)
Established Patient Office Visit  Subjective:  Patient ID: Brendan Santos, male    DOB: [birth year]  Age: 57 y.o. MRN: [id number]  CC:  Chief Complaint  Patient presents with   Follow-up    HPI Rainn L Rudnick presents to follow up on chronic medical conditions. Having nasal congestion, headache. No sinus pain/pressure. Some rhinorrhea clear. Coughing dry, was productive. Symptoms started 6 days ago, started to get better but bad again today.  Taking Nasacort, Claritin.   Allergic Rhinitis: -Currently on Nasacort, Claritin occasionally   HSV: -Currently on Valtrex 500 daily -Does have an active cold sore on his lip, hasn't had an outbreak in a while prior to this  Health Maintenance: -Blood work up to date  -Colon cancer screening: due    Past Medical History:  Diagnosis Date   Muscle twitch     History reviewed. No pertinent surgical history.  Family History  Problem Relation Age of Onset   Diabetes Mother    Heart disease Father    Cancer Father    Cancer Paternal Grandmother     Social History   Socioeconomic History   Marital status: Single    Spouse name: Not on file   Number of children: Not on file   Years of education: Not on file   Highest education level: Not on file  Occupational History   Not on file  Tobacco Use   Smoking status: Never   Smokeless tobacco: Never  Vaping Use   Vaping Use: Never used  Substance and Sexual Activity   Alcohol use: Not Currently    Comment: Moderate; drinks a few beers during the week, more on the weekend.   Drug use: No    Comment: History of LSD use.   Sexual activity: Yes  Other Topics Concern   Not on file  Social History Narrative   Not on file   Social Determinants of Health   Financial Resource Strain: Low Risk  (08/22/2021)   Overall Financial Resource Strain (CARDIA)    Difficulty of Paying Living Expenses: Not hard at all  Food Insecurity: No Food Insecurity (08/22/2021)   Hunger Vital Sign     Worried About Running Out of Food in the Last Year: Never true    Ran Out of Food in the Last Year: Never true  Transportation Needs: Unknown (08/22/2021)   PRAPARE - Hydrologist (Medical): No    Lack of Transportation (Non-Medical): Not on file  Physical Activity: Inactive (08/22/2021)   Exercise Vital Sign    Days of Exercise per Week: 0 days    Minutes of Exercise per Session: 0 min  Stress: Stress Concern Present (08/22/2021)   [place]    Feeling of Stress : To some extent  Social Connections: Socially Isolated (08/22/2021)   Social Connection and Isolation Panel [NHANES]    Frequency of Communication with Friends and Family: Twice a week    Frequency of Social Gatherings with Friends and Family: Twice a week    Attends Religious Services: Never    Marine scientist or Organizations: No    Attends Archivist Meetings: Never    Marital Status: Never married  Intimate Partner Violence: Not At Risk (08/22/2021)   Humiliation, Afraid, Rape, and Kick questionnaire    Fear of Current or Ex-Partner: No    Emotionally Abused: No    Physically Abused: No    Sexually  Abused: No    ROS Review of Systems  Constitutional:  Negative for chills and fever.  HENT:  Positive for congestion, postnasal drip, rhinorrhea and sore throat. Negative for sinus pressure and sinus pain.   Eyes:  Negative for visual disturbance.  Respiratory:  Positive for cough. Negative for shortness of breath.   Cardiovascular:  Negative for chest pain.  Gastrointestinal:  Negative for abdominal pain.    Objective:   Today's Vitals: BP 116/74   Pulse 75   Temp 97.6 F (36.4 C)   Resp 16   Ht '5\' 6"'$  (1.676 m)   Wt 148 lb 6.4 oz (67.3 kg)   SpO2 99%   BMI 23.95 kg/m   Physical Exam Constitutional:      Appearance: Normal appearance.  HENT:     Head: Normocephalic and atraumatic.     Right Ear:  Tympanic membrane, ear canal and external ear normal.     Left Ear: Tympanic membrane, ear canal and external ear normal.     Nose: Congestion present.     Mouth/Throat:     Mouth: Mucous membranes are moist.     Comments: Mild PND Eyes:     Conjunctiva/sclera: Conjunctivae normal.  Cardiovascular:     Rate and Rhythm: Normal rate and regular rhythm.  Pulmonary:     Effort: Pulmonary effort is normal.     Breath sounds: Normal breath sounds.  Skin:    General: Skin is warm and dry.  Neurological:     General: No focal deficit present.     Mental Status: He is alert. Mental status is at baseline.  Psychiatric:        Mood and Affect: Mood normal.        Behavior: Behavior normal.     Assessment & Plan:   1. Viral upper respiratory tract infection: Treat with Medrol dose pack, continue nasal steroid and anti-histamine.   - methylPREDNISolone (MEDROL DOSEPAK) 4 MG TBPK tablet; Day 1: Take 8 mg (2 tablets) before breakfast, 4 mg (1 tablet) after lunch, 4 mg (1 tablet) after supper, and 8 mg (2 tablets) at bedtime. Day 2:Take 4 mg (1 tablet) before breakfast, 4 mg (1 tablet) after lunch, 4 mg (1 tablet) after supper, and 8 mg (2 tablets) at bedtime. Day 3: Take 4 mg (1 tablet) before breakfast, 4 mg (1 tablet) after lunch, 4 mg (1 tablet) after supper, and 4 mg (1 tablet) at bedtime. Day 4: Take 4 mg (1 tablet) before breakfast, 4 mg (1 tablet) after lunch, and 4 mg (1 tablet) at bedtime. Day 5: Take 4 mg (1 tablet) before breakfast and 4 mg (1 tablet) at bedtime. Day 6: Take 4 mg (1 tablet) before breakfast.  Dispense: 1 each; Refill: 0  2. HSV-1 (herpes simplex virus 1) infection: Current outbreak, refill Valtrex 500 mg.   - valACYclovir (VALTREX) 500 MG tablet; Take 1 tablet (500 mg total) by mouth daily.  Dispense: 90 tablet; Refill: 3  3. Elevated blood sugar/Prediabetes: Sugar high on last CMP, A1c here 6.0%. Is now a pre-diabetic, discussed decreased sugar and carbs in the diet.  Will recheck at follow up.   - POCT HgB A1C  4. Colon cancer screening: Cologuard ordered.   - Cologuard   Follow-up: Return in about 6 months (around 10/13/2022).   Teodora Medici, DO

## 2022-04-14 ENCOUNTER — Ambulatory Visit: Payer: BC Managed Care – PPO | Admitting: Internal Medicine

## 2022-04-14 ENCOUNTER — Encounter: Payer: Self-pay | Admitting: Internal Medicine

## 2022-04-14 VITALS — BP 116/74 | HR 75 | Temp 97.6°F | Resp 16 | Ht 66.0 in | Wt 148.4 lb

## 2022-04-14 DIAGNOSIS — Z1211 Encounter for screening for malignant neoplasm of colon: Secondary | ICD-10-CM

## 2022-04-14 DIAGNOSIS — J069 Acute upper respiratory infection, unspecified: Secondary | ICD-10-CM

## 2022-04-14 DIAGNOSIS — R739 Hyperglycemia, unspecified: Secondary | ICD-10-CM

## 2022-04-14 DIAGNOSIS — B009 Herpesviral infection, unspecified: Secondary | ICD-10-CM

## 2022-04-14 DIAGNOSIS — R7303 Prediabetes: Secondary | ICD-10-CM | POA: Diagnosis not present

## 2022-04-14 LAB — POCT GLYCOSYLATED HEMOGLOBIN (HGB A1C): Hemoglobin A1C: 6 % — AB (ref 4.0–5.6)

## 2022-04-14 MED ORDER — METHYLPREDNISOLONE 4 MG PO TBPK: ORAL_TABLET | ORAL | 0 refills | Status: DC

## 2022-04-14 MED ORDER — VALACYCLOVIR HCL 500 MG PO TABS: 500.0000 mg | ORAL_TABLET | Freq: Every day | ORAL | 3 refills | Status: DC

## 2022-04-14 NOTE — Patient Instructions (Addendum)
It was great seeing you today!  Plan discussed at today's visit: -A1c today 6.0% which makes you a pre-diabetic. Try to reduce sugar and carbs in the diet. -Valtrex refilled -Cologuard ordered  -Plan for fasting labs at follow up  Follow up in: 6 months   Take care and let us know if you have any questions or concerns prior to your next visit.  Dr. Rosana Berger

## 2022-04-25 ENCOUNTER — Encounter: Payer: Self-pay | Admitting: Internal Medicine

## 2022-04-25 ENCOUNTER — Ambulatory Visit: Payer: BC Managed Care – PPO | Admitting: Internal Medicine

## 2022-04-25 VITALS — BP 128/74 | HR 69 | Temp 97.6°F | Resp 16 | Ht 66.0 in | Wt 148.0 lb

## 2022-04-25 DIAGNOSIS — B009 Herpesviral infection, unspecified: Secondary | ICD-10-CM

## 2022-04-25 DIAGNOSIS — J0100 Acute maxillary sinusitis, unspecified: Secondary | ICD-10-CM | POA: Diagnosis not present

## 2022-04-25 DIAGNOSIS — M542 Cervicalgia: Secondary | ICD-10-CM | POA: Diagnosis not present

## 2022-04-25 MED ORDER — AMOXICILLIN-POT CLAVULANATE 875-125 MG PO TABS: 1.0000 | ORAL_TABLET | Freq: Two times a day (BID) | ORAL | 0 refills | Status: AC

## 2022-04-25 MED ORDER — TIZANIDINE HCL 4 MG PO TABS: 4.0000 mg | ORAL_TABLET | Freq: Every evening | ORAL | 0 refills | Status: DC | PRN

## 2022-04-25 MED ORDER — VALACYCLOVIR HCL 500 MG PO TABS: 500.0000 mg | ORAL_TABLET | Freq: Every day | ORAL | 3 refills | Status: DC

## 2022-04-25 NOTE — Progress Notes (Signed)
Established Patient Office Visit  Subjective:  Patient ID: Brendan Santos, male    DOB: 1965-11-20  Age: 58 y.o. MRN: [id number]  CC:  Chief Complaint  Patient presents with   Sinusitis    Started a few days ago. Ear, jaw, and head pressure. Nasal drainage.    HPI Brendan Santos presents to follow up on continued sinus problems. He was seen for the same issue about 10 days ago.  Was having nasal congestion and headache.  He is now complaining of bilateral ear pain pressure and sinus pain/pressure.  He is also having clear rhinorrhea.  Denies cough, shortness of breath or wheezing.  He usually takes Claritin and nasal steroid.  At her last office visit he was treated with Medrol Dosepak, which she states improved his symptoms but then they returned as soon as he finished the medication.  HSV: -Currently on Valtrex 500 daily -Does have an active cold sore on his lip, hasn't had an outbreak in a while prior to this  Health Maintenance: -Blood work up to date  -Colon cancer screening: due    Past Medical History:  Diagnosis Date   Muscle twitch     No past surgical history on file.  Family History  Problem Relation Age of Onset   Diabetes Mother    Heart disease Father    Cancer Father    Cancer Paternal Grandmother     Social History   Socioeconomic History   Marital status: Single    Spouse name: Not on file   Number of children: Not on file   Years of education: Not on file   Highest education level: Not on file  Occupational History   Not on file  Tobacco Use   Smoking status: Never   Smokeless tobacco: Never  Vaping Use   Vaping Use: Never used  Substance and Sexual Activity   Alcohol use: Not Currently    Comment: Moderate; drinks a few beers during the week, more on the weekend.   Drug use: No    Comment: History of LSD use.   Sexual activity: Yes  Other Topics Concern   Not on file  Social History Narrative   Not on file   Social  Determinants of Health   Financial Resource Strain: Low Risk  (08/22/2021)   Overall Financial Resource Strain (CARDIA)    Difficulty of Paying Living Expenses: Not hard at all  Food Insecurity: No Food Insecurity (08/22/2021)   Hunger Vital Sign    Worried About Running Out of Food in the Last Year: Never true    Ran Out of Food in the Last Year: Never true  Transportation Needs: Unknown (08/22/2021)   PRAPARE - Hydrologist (Medical): No    Lack of Transportation (Non-Medical): Not on file  Physical Activity: Inactive (08/22/2021)   Exercise Vital Sign    Days of Exercise per Week: 0 days    Minutes of Exercise per Session: 0 min  Stress: Stress Concern Present (08/22/2021)   [place]    Feeling of Stress : To some extent  Social Connections: Socially Isolated (08/22/2021)   Social Connection and Isolation Panel [NHANES]    Frequency of Communication with Friends and Family: Twice a week    Frequency of Social Gatherings with Friends and Family: Twice a week    Attends Religious Services: Never    Marine scientist or Organizations: No  Attends Archivist Meetings: Never    Marital Status: Never married  Intimate Partner Violence: Not At Risk (08/22/2021)   Humiliation, Afraid, Rape, and Kick questionnaire    Fear of Current or Ex-Partner: No    Emotionally Abused: No    Physically Abused: No    Sexually Abused: No    ROS Review of Systems  Constitutional:  Negative for chills and fever.  HENT:  Positive for congestion, postnasal drip, rhinorrhea, sinus pressure and sinus pain.   Eyes:  Negative for visual disturbance.  Respiratory:  Negative for cough and shortness of breath.   Cardiovascular:  Negative for chest pain.  Gastrointestinal:  Negative for abdominal pain.    Objective:   Today's Vitals: BP 128/74   Pulse 69   Temp 97.6 F (36.4 C) (Oral)   Resp 16   Ht  '5\' 6"'$  (1.676 m)   Wt 148 lb (67.1 kg)   SpO2 98%   BMI 23.89 kg/m   Physical Exam Constitutional:      Appearance: Normal appearance.  HENT:     Head: Normocephalic and atraumatic.     Right Ear: Tympanic membrane, ear canal and external ear normal.     Left Ear: Tympanic membrane, ear canal and external ear normal.     Nose: Rhinorrhea present.     Mouth/Throat:     Mouth: Mucous membranes are moist.     Comments: Mild PND Eyes:     Conjunctiva/sclera: Conjunctivae normal.  Cardiovascular:     Rate and Rhythm: Normal rate and regular rhythm.  Pulmonary:     Effort: Pulmonary effort is normal.     Breath sounds: Normal breath sounds.  Skin:    General: Skin is warm and dry.  Neurological:     General: No focal deficit present.     Mental Status: He is alert. Mental status is at baseline.  Psychiatric:        Mood and Affect: Mood normal.        Behavior: Behavior normal.     Assessment & Plan:   1. Acute non-recurrent maxillary sinusitis: Steroids improved symptoms but they have not completely resolved.  Will treat with Augmentin twice daily x 5 days.  Continue nasal steroid.  - amoxicillin-clavulanate (AUGMENTIN) 875-125 MG tablet; Take 1 tablet by mouth 2 (two) times daily for 5 days.  Dispense: 10 tablet; Refill: 0  2. Neck pain: Zanaflex as needed prescribed for neck tightness/pain.  - tiZANidine (ZANAFLEX) 4 MG tablet; Take 1 tablet (4 mg total) by mouth at bedtime as needed for muscle spasms.  Dispense: 30 tablet; Refill: 0  3. HSV-1 (herpes simplex virus 1) infection: Valtrex refilled.  - valACYclovir (VALTREX) 500 MG tablet; Take 1 tablet (500 mg total) by mouth daily.  Dispense: 90 tablet; Refill: 3   Follow-up: Return if symptoms worsen or fail to improve.   Teodora Medici, DO

## 2022-05-17 ENCOUNTER — Other Ambulatory Visit: Payer: Self-pay | Admitting: Internal Medicine

## 2022-05-17 DIAGNOSIS — M542 Cervicalgia: Secondary | ICD-10-CM

## 2022-05-17 NOTE — Telephone Encounter (Signed)
Requested medication (s) are due for refill today: yes  Requested medication (s) are on the active medication list: yes  Last refill:  04/25/22  Future visit scheduled: yes  Notes to clinic:  [place]. DX Code Needed. Unable to refill per protocol, cannot delegate.      Requested Prescriptions  Pending Prescriptions Disp Refills   tiZANidine (ZANAFLEX) 4 MG tablet [Pharmacy Med Name: TIZANIDINE HCL 4 MG TABLET] 90 tablet 1    Sig: TAKE 1 TABLET BY MOUTH AT BEDTIME AS NEEDED FOR MUSCLE SPASMS.     Not Delegated - Cardiovascular:  Alpha-2 Agonists - tizanidine Failed - 05/17/2022  8:30 AM      Failed - This refill cannot be delegated      Passed - Valid encounter within last 6 months    Recent Outpatient Visits           3 weeks ago Acute non-recurrent maxillary sinusitis   [facility] Teodora Medici, DO   1 month ago Viral upper respiratory tract infection   [facility] Teodora Medici, DO   8 months ago Encounter for annual general medical examination without abnormal findings in adult   [place], DO   10 months ago Arthralgia of right foot   [facility] Teodora Medici, DO   1 year ago Strain of lumbar region, initial encounter   [facility] Teodora Medici, DO       Future Appointments             In 5 months Teodora Medici, [facility], [facility]

## 2022-05-18 LAB — COLOGUARD: COLOGUARD: NEGATIVE

## 2022-10-13 ENCOUNTER — Ambulatory Visit: Payer: BC Managed Care – PPO | Admitting: Internal Medicine

## 2022-10-17 NOTE — Progress Notes (Signed)
Name: Brendan Santos   MRN: [id number]    DOB: 1965-04-11   Date:10/20/2022       Progress Note  Subjective  Chief Complaint  Chief Complaint  Patient presents with   Annual Exam    HPI  Patient presents for annual CPE.  IPSS Questionnaire (AUA-7): Over the past month.   1)  How often have you had a sensation of not emptying your bladder completely after you finish urinating?  1 - Less than 1 time in 5  2)  How often have you had to urinate again less than two hours after you finished urinating? 0 - Not at all  3)  How often have you found you stopped and started again several times when you urinated?  2 - Less than half the time  4) How difficult have you found it to postpone urination?  0 - Not at all  5) How often have you had a weak urinary stream?  0 - Not at all  6) How often have you had to push or strain to begin urination?  1 - Less than 1 time in 5  7) How many times did you most typically get up to urinate from the time you went to bed until the time you got up in the morning?  0 - None  Total score:  0-7 mildly symptomatic   8-19 moderately symptomatic   20-35 severely symptomatic     Diet: well rounded Exercise: push mower for yard Last Dental Exam:UTD Last Eye Exam: UTD  Depression: phq 9 is negative    10/20/2022    2:40 PM 04/25/2022   11:43 AM 04/14/2022    3:23 PM 08/22/2021    2:20 PM 07/21/2021   11:05 AM  Depression screen PHQ 2/9  Decreased Interest 0 0 0 0 0  Down, Depressed, Hopeless 1 0 0 0 0  PHQ - 2 Score 1 0 0 0 0  Altered sleeping 0  0 0 0  Tired, decreased energy 0  0 0 0  Change in appetite 0  0 0 0  Feeling bad or failure about yourself  0  0 0 0  Trouble concentrating 0  0 0 0  Moving slowly or fidgety/restless 0  0 0 0  Suicidal thoughts 0  0 0 0  PHQ-9 Score 1  0 0 0  Difficult doing work/chores Not difficult at all  Not difficult at all Not difficult at all Not difficult at all    Hypertension:  BP Readings from Last 3  Encounters:  10/20/22 120/80  04/25/22 128/74  04/14/22 116/74    Obesity: Wt Readings from Last 3 Encounters:  10/20/22 144 lb 9.6 oz (65.6 kg)  04/25/22 148 lb (67.1 kg)  04/14/22 148 lb 6.4 oz (67.3 kg)   BMI Readings from Last 3 Encounters:  10/20/22 23.34 kg/m  04/25/22 23.89 kg/m  04/14/22 23.95 kg/m     Lipids:  Lab Results  Component Value Date   CHOL 273 (H) 08/22/2021   CHOL 291 (H) 10/04/2016   Lab Results  Component Value Date   HDL 78 08/22/2021   HDL 67 10/04/2016   Lab Results  Component Value Date   LDLCALC 170 (H) 08/22/2021   LDLCALC 205 (H) 10/04/2016   Lab Results  Component Value Date   TRIG 120 08/22/2021   TRIG 94 10/04/2016   Lab Results  Component Value Date   CHOLHDL 3.5 08/22/2021   CHOLHDL 4.3 10/04/2016  No results found for: "LDLDIRECT" Glucose:  Glucose  Date Value Ref Range Status  10/04/2016 103 (H) 65 - 99 mg/dL Final   Glucose, Bld  Date Value Ref Range Status  03/12/2021 119 (H) 70 - 99 mg/dL Final    Comment:    Glucose reference range applies only to samples taken after fasting for at least 8 hours.  05/30/2016 207 (H) 65 - 99 mg/dL Final     Single STD testing and prevention (HIV/chl/gon/syphilis):  would like to be screened today  Hep C Screening: complete Skin cancer: Discussed monitoring for atypical lesions Colorectal cancer: cologuard 05/13/22 Prostate cancer:  yes Lab Results  Component Value Date   PSA 0.64 08/22/2021   Lung cancer:  Low Dose CT Chest recommended if Age 32-80 years, 30 pack-year currently smoking OR have quit w/in 15years. Patient  not applicable a candidate for screening   AAA: The USPSTF recommends one-time screening with ultrasonography in men ages 25 to 75 years who have ever smoked. Patient   not applicable, a candidate for screening  ECG:  05/30/16  Vaccines:  HPV: n/a Tdap:03/23/21  Shingrix: Discussed obtaining at the pharmacy Pneumonia: n/a Flu: declines  today COVID-19:N/a  Advanced Care Planning: A voluntary discussion about advance care planning including the explanation and discussion of advance directives.  Discussed health care proxy and Living will, and the patient was unable to identify a health care proxy.  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 No pertinent surgical history.  Family History  Problem Relation Age of Onset   Diabetes Mother    Heart disease Father    Cancer Father    Cancer Paternal Grandmother     Social History   Socioeconomic History   Marital status: Single    Spouse name: Not on file   Number of children: Not on file   Years of education: Not on file   Highest education level: Not on file  Occupational History   Not on file  Tobacco Use   Smoking status: Never   Smokeless tobacco: Never  Vaping Use   Vaping status: Never Used  Substance and Sexual Activity   Alcohol use: Not Currently    Comment: Moderate; drinks a few beers during the week, more on the weekend.   Drug use: No    Comment: History of LSD use.   Sexual activity: Yes  Other Topics Concern   Not on file  Social History Narrative   Not on file   Social Determinants of Health   Financial Resource Strain: Low Risk  (08/22/2021)   Overall Financial Resource Strain (CARDIA)    Difficulty of Paying Living Expenses: Not hard at all  Food Insecurity: No Food Insecurity (08/22/2021)   Hunger Vital Sign    Worried About Running Out of Food in the Last Year: Never true    Ran Out of Food in the Last Year: Never true  Transportation Needs: Unknown (08/22/2021)   PRAPARE - Administrator, Civil Service (Medical): No    Lack of Transportation (Non-Medical): Not on file  Physical Activity: Inactive (08/22/2021)   Exercise Vital Sign     Days of Exercise per Week: 0 days    Minutes of Exercise per Session: 0 min  Stress: Stress Concern Present (  08/22/2021)   [employer] - Occupational Stress Questionnaire    Feeling of Stress : To some extent  Social Connections: Socially Isolated (08/22/2021)   Social Connection and Isolation Panel [NHANES]    Frequency of Communication with Friends and Family: Twice a week    Frequency of Social Gatherings with Friends and Family: Twice a week    Attends Religious Services: Never    Database administrator or Organizations: No    Attends Banker Meetings: Never    Marital Status: Never married  Intimate Partner Violence: Not At Risk (08/22/2021)   Humiliation, Afraid, Rape, and Kick questionnaire    Fear of Current or Ex-Partner: No    Emotionally Abused: No    Physically Abused: No    Sexually Abused: No     Current Outpatient Medications:    fluticasone (FLONASE) 50 MCG/ACT nasal spray, Place 2 sprays into both nostrils daily., Disp: 16 g, Rfl: 3   tiZANidine (ZANAFLEX) 4 MG tablet, TAKE 1 TABLET BY MOUTH AT BEDTIME AS NEEDED FOR MUSCLE SPASMS., Disp: 90 tablet, Rfl: 0   valACYclovir (VALTREX) 500 MG tablet, Take 1 tablet (500 mg total) by mouth daily., Disp: 90 tablet, Rfl: 3  Allergies  Allergen Reactions   No Known Allergies      Review of Systems  All other systems reviewed and are negative.      Objective  Vitals:   10/20/22 1438  BP: 120/80  Pulse: 67  Temp: (!) 97.5 F (36.4 C)  SpO2: 97%  Weight: 144 lb 9.6 oz (65.6 kg)  Height: 5\' 6"  (1.676 m)    Body mass index is 23.34 kg/m.  Physical Exam Constitutional:      Appearance: Normal appearance.  HENT:     Head: Normocephalic and atraumatic.     Mouth/Throat:     Mouth: Mucous membranes are moist.     Pharynx: Oropharynx is clear.  Eyes:     Extraocular Movements: Extraocular movements intact.     Conjunctiva/sclera: Conjunctivae normal.     Pupils: Pupils  are equal, round, and reactive to light.  Neck:     Comments: No thyromegaly  Cardiovascular:     Rate and Rhythm: Normal rate and regular rhythm.  Pulmonary:     Effort: Pulmonary effort is normal.     Breath sounds: Normal breath sounds.  Abdominal:     General: There is no distension.     Palpations: Abdomen is soft.     Tenderness: There is no abdominal tenderness.  Musculoskeletal:     Cervical back: No tenderness.     Right lower leg: No edema.     Left lower leg: No edema.  Lymphadenopathy:     Cervical: No cervical adenopathy.  Skin:    General: Skin is warm and dry.  Neurological:     General: No focal deficit present.     Mental Status: He is alert. Mental status is at baseline.  Psychiatric:        Mood and Affect: Mood normal.        Behavior: Behavior normal.     No results found for this or any previous visit (from the past 2160 hour(s)).   Fall Risk:    10/20/2022    2:39 PM 04/25/2022   11:43 AM 04/14/2022    3:21 PM 08/22/2021    2:20 PM 07/21/2021   11:02 AM  Fall Risk   Falls in the past year? 0 0  0 0 0  Number falls in past yr: 0 0 0 0 0  Injury with Fall? 0 0 0 0 0  Risk for fall due to :  No Fall Risks     Follow up  Falls prevention discussed         Functional Status Survey: Is the patient deaf or have difficulty hearing?: Yes Does the patient have difficulty seeing, even when wearing glasses/contacts?: No Does the patient have difficulty concentrating, remembering, or making decisions?: Yes Does the patient have difficulty walking or climbing stairs?: No Does the patient have difficulty dressing or bathing?: No Does the patient have difficulty doing errands alone such as visiting a doctor's office or shopping?: No    Assessment & Plan  1. Annual physical exam/Lipid screening/Prostate cancer screening/Prediabetes: Health maintenance reviewed, physical complete, labs ordered.  - CBC w/Diff/Platelet - COMPLETE METABOLIC PANEL WITH GFR -  PSA - Lipid Profile - RPR - HIV antibody (with reflex) - Urine cytology ancillary only - tiZANidine (ZANAFLEX) 4 MG tablet; Take 1 tablet (4 mg total) by mouth at bedtime as needed for muscle spasms.  Dispense: 90 tablet; Refill: 0 - HgB A1c  2. Screening examination for STD (sexually transmitted disease): Screening today.  - RPR - HIV antibody (with reflex) - Urine cytology ancillary only  3. Neck pain: Muscle relaxer refilled.  - tiZANidine (ZANAFLEX) 4 MG tablet; Take 1 tablet (4 mg total) by mouth at bedtime as needed for muscle spasms.  Dispense: 90 tablet; Refill: 0    -Prostate cancer screening and PSA options (with potential risks and benefits of testing vs not testing) were discussed along with recent recs/guidelines. -USPSTF grade A and B recommendations reviewed with patient; age-appropriate recommendations, preventive care, screening tests, etc discussed and encouraged; healthy living encouraged; see AVS for patient education given to patient -Discussed importance of 150 minutes of physical activity weekly, eat two servings of fish weekly, eat one serving of tree nuts ( cashews, pistachios, pecans, almonds.Marland Kitchen) every other day, eat 6 servings of fruit/vegetables daily and drink plenty of water and avoid sweet beverages.  -Reviewed Health Maintenance: yes

## 2022-10-20 ENCOUNTER — Encounter: Payer: Self-pay | Admitting: Internal Medicine

## 2022-10-20 ENCOUNTER — Ambulatory Visit: Payer: BC Managed Care – PPO | Admitting: Internal Medicine

## 2022-10-20 ENCOUNTER — Other Ambulatory Visit (HOSPITAL_COMMUNITY)
Admission: RE | Admit: 2022-10-20 | Discharge: 2022-10-20 | Disposition: A | Discharge status: Home / Self Care | Payer: BC Managed Care – PPO | Source: Ambulatory Visit | Attending: Internal Medicine | Admitting: Internal Medicine

## 2022-10-20 VITALS — BP 120/80 | HR 67 | Temp 97.5°F | Ht 66.0 in | Wt 144.6 lb

## 2022-10-20 DIAGNOSIS — Z113 Encounter for screening for infections with a predominantly sexual mode of transmission: Secondary | ICD-10-CM

## 2022-10-20 DIAGNOSIS — M542 Cervicalgia: Secondary | ICD-10-CM

## 2022-10-20 DIAGNOSIS — Z13220 Encounter for screening for lipoid disorders: Secondary | ICD-10-CM | POA: Diagnosis not present

## 2022-10-20 DIAGNOSIS — R7303 Prediabetes: Secondary | ICD-10-CM

## 2022-10-20 DIAGNOSIS — Z0000 Encounter for general adult medical examination without abnormal findings: Secondary | ICD-10-CM | POA: Diagnosis present

## 2022-10-20 DIAGNOSIS — Z125 Encounter for screening for malignant neoplasm of prostate: Secondary | ICD-10-CM | POA: Diagnosis not present

## 2022-10-20 MED ORDER — TIZANIDINE HCL 4 MG PO TABS: 4.0000 mg | ORAL_TABLET | Freq: Every evening | ORAL | 0 refills | Status: DC | PRN

## 2022-10-21 LAB — CBC WITH DIFFERENTIAL/PLATELET
Absolute Monocytes: 468 {cells}/uL (ref 200–950)
Basophils Absolute: 21 {cells}/uL (ref 0–200)
Basophils Relative: 0.4 %
Eosinophils Absolute: 88 {cells}/uL (ref 15–500)
Eosinophils Relative: 1.7 %
HCT: 41.8 % (ref 38.5–50.0)
Hemoglobin: 14.4 g/dL (ref 13.2–17.1)
Lymphs Abs: 1050 {cells}/uL (ref 850–3900)
MCH: 32.7 pg (ref 27.0–33.0)
MCHC: 34.4 g/dL (ref 32.0–36.0)
MCV: 95 fL (ref 80.0–100.0)
MPV: 10.5 fL (ref 7.5–12.5)
Monocytes Relative: 9 %
Neutro Abs: 3572 {cells}/uL (ref 1500–7800)
Neutrophils Relative %: 68.7 %
Platelets: 201 10*3/uL (ref 140–400)
RBC: 4.4 10*6/uL (ref 4.20–5.80)
RDW: 11.8 % (ref 11.0–15.0)
Total Lymphocyte: 20.2 %
WBC: 5.2 10*3/uL (ref 3.8–10.8)

## 2022-10-21 LAB — ADVANCED WRITTEN NOTIFICATION (AWN) TEST REFUSAL: AWN TEST REFUSED: 7600

## 2022-10-21 LAB — COMPLETE METABOLIC PANEL WITH GFR
AG Ratio: 1.5 (calc) (ref 1.0–2.5)
ALT: 31 U/L (ref 9–46)
AST: 21 U/L (ref 10–35)
Albumin: 4.4 g/dL (ref 3.6–5.1)
Alkaline phosphatase (APISO): 52 U/L (ref 35–144)
BUN: 10 mg/dL (ref 7–25)
CO2: 28 mmol/L (ref 20–32)
Calcium: 9.7 mg/dL (ref 8.6–10.3)
Chloride: 101 mmol/L (ref 98–110)
Creat: 1.01 mg/dL (ref 0.70–1.30)
Globulin: 2.9 g/dL (ref 1.9–3.7)
Glucose, Bld: 87 mg/dL (ref 65–99)
Potassium: 4.4 mmol/L (ref 3.5–5.3)
Sodium: 139 mmol/L (ref 135–146)
Total Bilirubin: 1.2 mg/dL (ref 0.2–1.2)
Total Protein: 7.3 g/dL (ref 6.1–8.1)
eGFR: 87 mL/min/{1.73_m2} (ref >=60)

## 2022-10-21 LAB — HEMOGLOBIN A1C
Hgb A1c MFr Bld: 5.7 %{Hb} — ABNORMAL HIGH (ref <5.7)
Mean Plasma Glucose: 117 mg/dL
eAG (mmol/L): 6.5 mmol/L

## 2022-10-21 LAB — HIV ANTIBODY (ROUTINE TESTING W REFLEX): HIV 1&2 Ab, 4th Generation: NONREACTIVE

## 2022-10-21 LAB — PSA: PSA: 0.55 ng/mL (ref <=4.00)

## 2022-10-21 LAB — RPR: RPR Ser Ql: NONREACTIVE

## 2022-10-26 LAB — URINE CYTOLOGY ANCILLARY ONLY
Chlamydia: NEGATIVE
Comment: NEGATIVE
Comment: NEGATIVE
Comment: NORMAL
Neisseria Gonorrhea: NEGATIVE
Trichomonas: NEGATIVE

## 2022-12-01 ENCOUNTER — Other Ambulatory Visit: Payer: Self-pay

## 2022-12-01 ENCOUNTER — Ambulatory Visit: Payer: BC Managed Care – PPO | Admitting: Nurse Practitioner

## 2022-12-01 VITALS — BP 120/80 | HR 68 | Temp 98.6°F | Resp 18 | Ht 66.0 in | Wt 143.3 lb

## 2022-12-01 DIAGNOSIS — J0140 Acute pansinusitis, unspecified: Secondary | ICD-10-CM | POA: Diagnosis not present

## 2022-12-01 DIAGNOSIS — M109 Gout, unspecified: Secondary | ICD-10-CM

## 2022-12-01 MED ORDER — COLCHICINE 0.6 MG PO TABS
0.6000 mg | ORAL_TABLET | Freq: Two times a day (BID) | ORAL | 3 refills | Status: DC
Start: 2022-12-01 — End: 2022-12-01

## 2022-12-01 MED ORDER — AMOXICILLIN-POT CLAVULANATE 875-125 MG PO TABS
1.0000 | ORAL_TABLET | Freq: Two times a day (BID) | ORAL | 0 refills | Status: DC
Start: 2022-12-01 — End: 2023-11-14

## 2022-12-01 MED ORDER — COLCHICINE 0.6 MG PO TABS
ORAL_TABLET | ORAL | 3 refills | Status: DC
Start: 2022-12-01 — End: 2023-11-14

## 2022-12-01 MED ORDER — COLCHICINE 0.6 MG PO TABS
ORAL_TABLET | ORAL | 3 refills | Status: DC
Start: 2022-12-01 — End: 2022-12-01

## 2022-12-01 NOTE — Addendum Note (Signed)
Addended by: Della Goo F on: 12/01/2022 02:16 PM   Modules accepted: Orders

## 2022-12-01 NOTE — Progress Notes (Addendum)
BP 120/80   Pulse 68   Temp 98.6 F (37 C) (Oral)   Resp 18   Ht 5\' 6"  (1.676 m)   Wt 143 lb 4.8 oz (65 kg)   SpO2 98%   BMI 23.13 kg/m    Subjective:    Patient ID: Brendan Santos, male    DOB: Jul 20, 1965, 57 y.o.   MRN: [id number]  HPI: Brendan Santos is a 57 y.o. male  Chief Complaint  Patient presents with   Sinusitis    For 4 days   Sinus infection Compliant: symptoms started 14 days ago -Fever: subjective -Cough: yes this am -Shortness of breath: no -Wheezing: no -Chest congestion: no -Nasal congestion: yes -Runny nose: yes -Post nasal drip: yes, bitter tasting -Sore throat: yes -Sinus pressure: yes -Headache: yes -Face pain: yes -Ear pain:  no -Ear pressure: yes -Relief with OTC cold/cough medications: flonase, a little bit  Recommend taking zyrtec, flonase, mucinex, vitamin d, vitamin c, and zinc. Push fluids and get rest.    Will treat patient with Augmentin for his sinus infection.  Gout: patient reports he currently has a gout flare in his right foot.  Discussed starting colchicine for pain.  Instructed patient to let us know when he is no longer having an acute flare and we can start him on allopurinol. Recommend eating a low purine diet. Take colchicine for acute pain,  start with 1.2 mg and then may take 0.6 mg 1 hour later.  Then take 0.6 mg daily for duration of flare.   Relevant past medical, surgical, family and social history reviewed and updated as indicated. Interim medical history since our last visit reviewed. Allergies and medications reviewed and updated.  Review of Systems  Ten systems reviewed and is negative except as mentioned in HPI       Objective:    BP 120/80   Pulse 68   Temp 98.6 F (37 C) (Oral)   Resp 18   Ht 5\' 6"  (1.676 m)   Wt 143 lb 4.8 oz (65 kg)   SpO2 98%   BMI 23.13 kg/m   Wt Readings from Last 3 Encounters:  12/01/22 143 lb 4.8 oz (65 kg)  10/20/22 144 lb 9.6 oz (65.6 kg)  04/25/22 148 lb (67.1  kg)    Physical Exam  Constitutional: Patient appears well-developed and well-nourished.  No distress.  HEENT: head atraumatic, normocephalic, pupils equal and reactive to light, ears TMs clear, neck supple, throat within normal limits, no lymphadenopathy,  facial tenderness Cardiovascular: Normal rate, regular rhythm and normal heart sounds.  No murmur heard. No BLE edema. Pulmonary/Chest: Effort normal and breath sounds normal. No respiratory distress. Abdominal: Soft.  There is no tenderness. MSK: right foot tenderness Psychiatric: Patient has a normal mood and affect. behavior is normal. Judgment and thought content normal.  Results for orders placed or performed in visit on 10/20/22  CBC w/Diff/Platelet  Result Value Ref Range   WBC 5.2 3.8 - 10.8 Thousand/uL   RBC 4.40 4.20 - 5.80 Million/uL   Hemoglobin 14.4 13.2 - 17.1 g/dL   HCT 40.9 81.1 - 91.4 %   MCV 95.0 80.0 - 100.0 fL   MCH 32.7 27.0 - 33.0 pg   MCHC 34.4 32.0 - 36.0 g/dL   RDW 78.2 95.6 - 21.3 %   Platelets 201 140 - 400 Thousand/uL   MPV 10.5 7.5 - 12.5 fL   Neutro Abs 3,572 1,500 - 7,800 cells/uL   Lymphs Abs 1,050  850 - 3,900 cells/uL   Absolute Monocytes 468 200 - 950 cells/uL   Eosinophils Absolute 88 15 - 500 cells/uL   Basophils Absolute 21 0 - 200 cells/uL   Neutrophils Relative % 68.7 %   Total Lymphocyte 20.2 %   Monocytes Relative 9.0 %   Eosinophils Relative 1.7 %   Basophils Relative 0.4 %  COMPLETE METABOLIC PANEL WITH GFR  Result Value Ref Range   Glucose, Bld 87 65 - 99 mg/dL   BUN 10 7 - 25 mg/dL   Creat 0.34 7.42 - 5.95 mg/dL   eGFR 87 > OR = 60 GL/OVF/6.43P2   BUN/Creatinine Ratio SEE NOTE: 6 - 22 (calc)   Sodium 139 135 - 146 mmol/L   Potassium 4.4 3.5 - 5.3 mmol/L   Chloride 101 98 - 110 mmol/L   CO2 28 20 - 32 mmol/L   Calcium 9.7 8.6 - 10.3 mg/dL   Total Protein 7.3 6.1 - 8.1 g/dL   Albumin 4.4 3.6 - 5.1 g/dL   Globulin 2.9 1.9 - 3.7 g/dL (calc)   AG Ratio 1.5 1.0 - 2.5 (calc)    Total Bilirubin 1.2 0.2 - 1.2 mg/dL   Alkaline phosphatase (APISO) 52 35 - 144 U/L   AST 21 10 - 35 U/L   ALT 31 9 - 46 U/L  PSA  Result Value Ref Range   PSA 0.55 < OR = 4.00 ng/mL  RPR  Result Value Ref Range   RPR Ser Ql NON-REACTIVE NON-REACTIVE  HIV antibody (with reflex)  Result Value Ref Range   HIV 1&2 Ab, 4th Generation NON-REACTIVE NON-REACTIVE  HgB A1c  Result Value Ref Range   Hgb A1c MFr Bld 5.7 (H) <5.7 % of total Hgb   Mean Plasma Glucose 117 mg/dL   eAG (mmol/L) 6.5 mmol/L  Advanced Written Notification [facility]) Test Refusal  Result Value Ref Range   RAM1     AWN TEST REFUSED 7600   Urine cytology ancillary only  Result Value Ref Range   Neisseria Gonorrhea Negative    Chlamydia Negative    Trichomonas Negative    Comment Normal Reference Range Trichomonas - Negative    Comment Normal Reference Ranger Chlamydia - Negative    Comment      Normal Reference Range Neisseria Gonorrhea - Negative      Assessment & Plan:   Problem List Items Addressed This Visit   None Visit Diagnoses     Acute non-recurrent pansinusitis    -  Primary   Recommend taking zyrtec, flonase, mucinex, vitamin d, vitamin c, and zinc. Push fluids and get rest.    start Augmentin for his sinus infection.   Relevant Medications   amoxicillin-clavulanate (AUGMENTIN) 875-125 MG tablet   Acute gout of right foot, unspecified cause       start colchicine for pain,  after acute flare is cleared up, we can start you on allopurinol for prevention.   Relevant Medications   colchicine 0.6 MG tablet        Follow up plan: Return if symptoms worsen or fail to improve.

## 2023-01-29 ENCOUNTER — Other Ambulatory Visit: Payer: Self-pay | Admitting: Internal Medicine

## 2023-01-29 DIAGNOSIS — M542 Cervicalgia: Secondary | ICD-10-CM

## 2023-01-29 DIAGNOSIS — Z0000 Encounter for general adult medical examination without abnormal findings: Secondary | ICD-10-CM

## 2023-01-30 NOTE — Telephone Encounter (Signed)
Requested medication (s) are due for refill today: Yes  Requested medication (s) are on the active medication list: Yes  Last refill:  10/20/22  Future visit scheduled: Yes  Notes to clinic:  Unable to refill per protocol, cannot delegate.      Requested Prescriptions  Pending Prescriptions Disp Refills   tiZANidine (ZANAFLEX) 4 MG tablet [Pharmacy Med Name: TIZANIDINE HCL 4 MG TABLET] 30 tablet 2    Sig: TAKE 1 TABLET BY MOUTH AT BEDTIME AS NEEDED FOR MUSCLE SPASMS.     Not Delegated - Cardiovascular:  Alpha-2 Agonists - tizanidine Failed - 01/29/2023  1:34 AM      Failed - This refill cannot be delegated      Passed - Valid encounter within last 6 months    Recent Outpatient Visits           2 months ago Acute non-recurrent pansinusitis   [facility] Berniece Salines, FNP   3 months ago Annual physical exam   [facility] Margarita Mail, DO   9 months ago Acute non-recurrent maxillary sinusitis   [facility] Margarita Mail, DO   9 months ago Viral upper respiratory tract infection   [facility] Margarita Mail, DO   1 year ago Encounter for annual general medical examination without abnormal findings in adult   [facility] Margarita Mail, DO       Future Appointments             In 8 months Margarita Mail, DO [facility], [facility]

## 2023-06-20 IMAGING — CT CT ABD-PELV W/ CM
2 of 5 series · 16 of 46 positions shown, 18 images · IV contrast (agent unspecified)
Comparison: None.

CLINICAL DATA: Abdominal pain.

EXAM:
CT ABDOMEN AND PELVIS WITH CONTRAST
TECHNIQUE: Multidetector CT imaging of the abdomen and pelvis was performed
using the standard protocol following bolus administration of
intravenous contrast.

[Series 2: routine abd/pel with · axial · 0.74mm/px · z∈[-424,+21]mm · 13 of 99 slices shown, 15 images]
[im 5/99  soft-tissue]
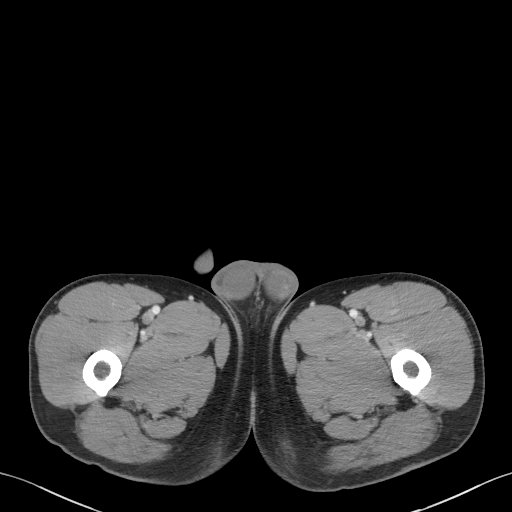
[im 5/99  bone]
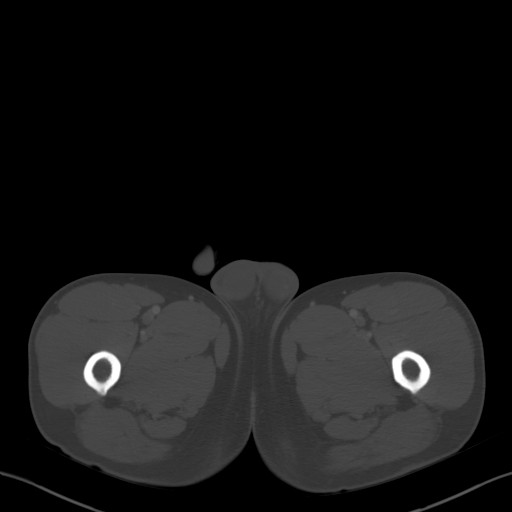
[im 15/99  soft-tissue]
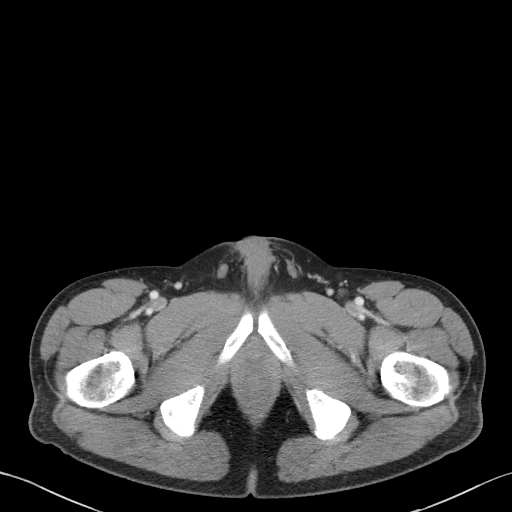
[im 20/99  soft-tissue]
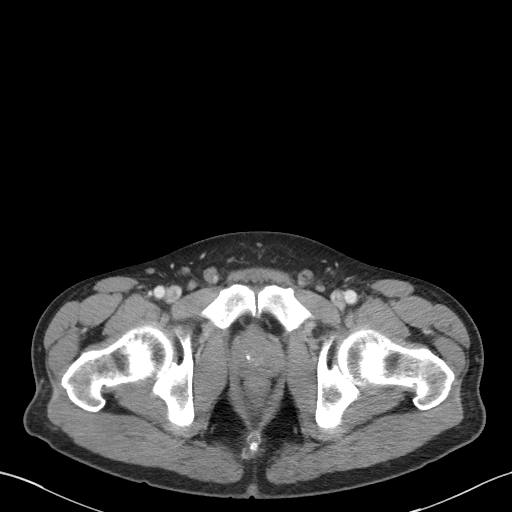
[im 30/99  soft-tissue]
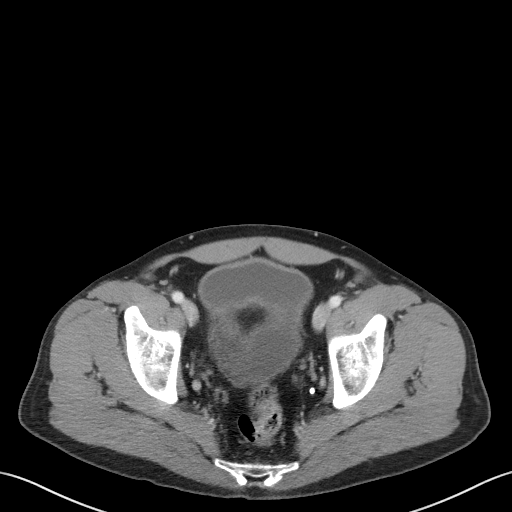
[im 35/99  soft-tissue]
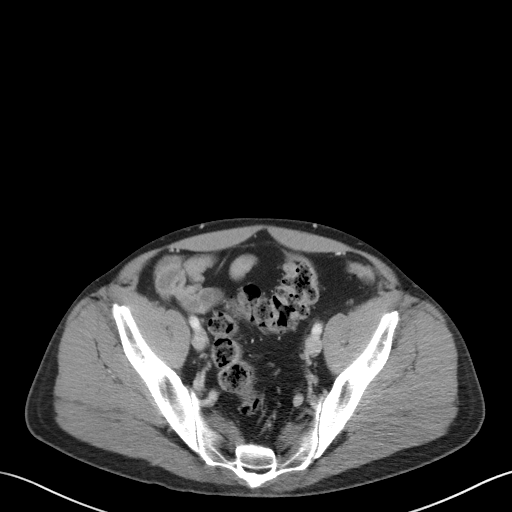
[im 45/99  soft-tissue]
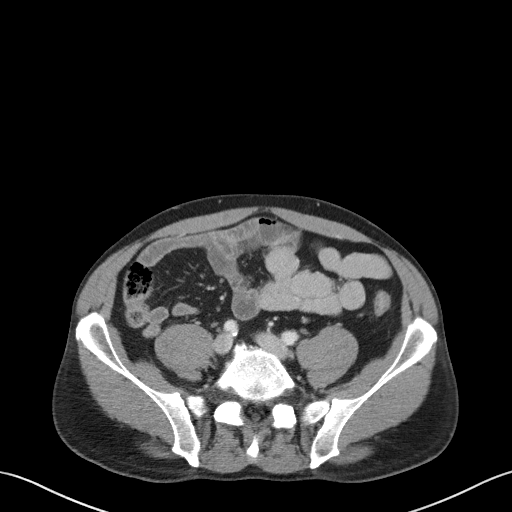
[im 50/99  soft-tissue]
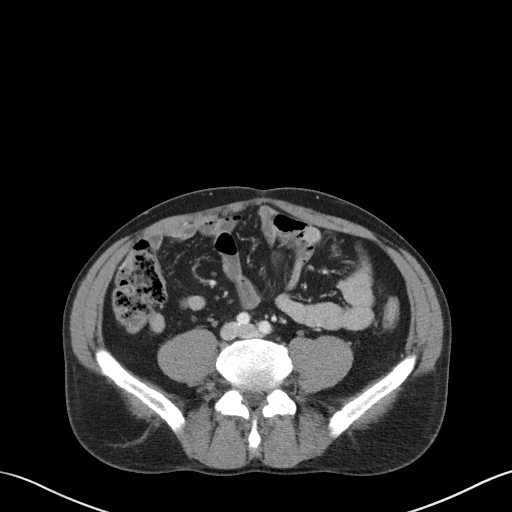
[im 54/99  soft-tissue]
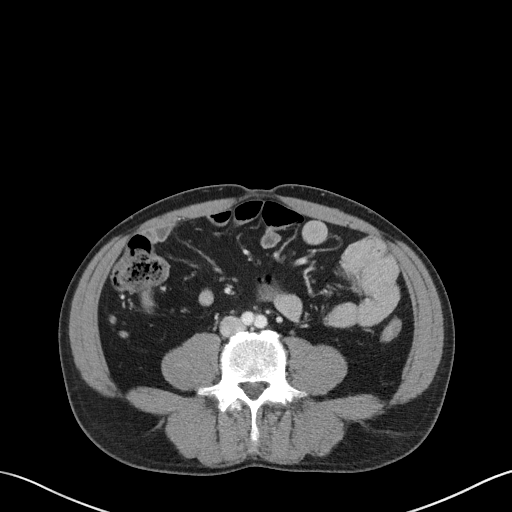
[im 64/99  soft-tissue]
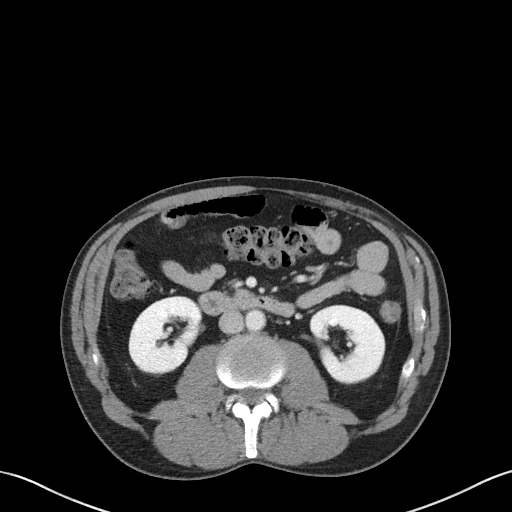
[im 64/99  bone]
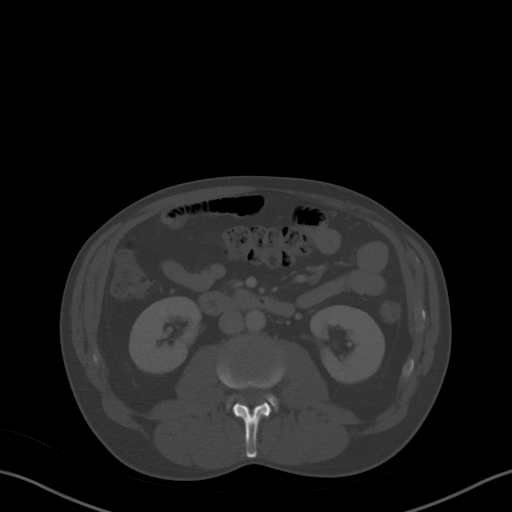
[im 69/99  soft-tissue]
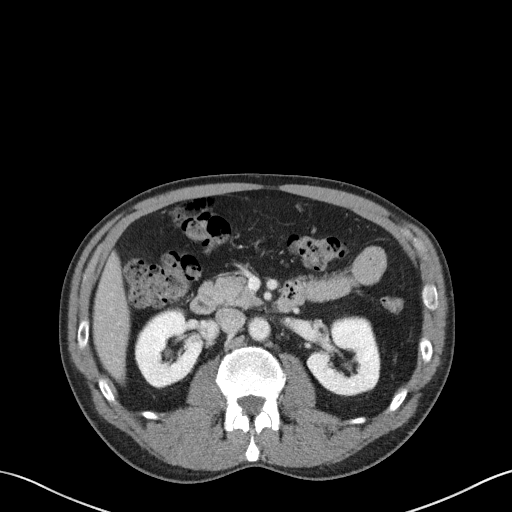
[im 79/99  soft-tissue]
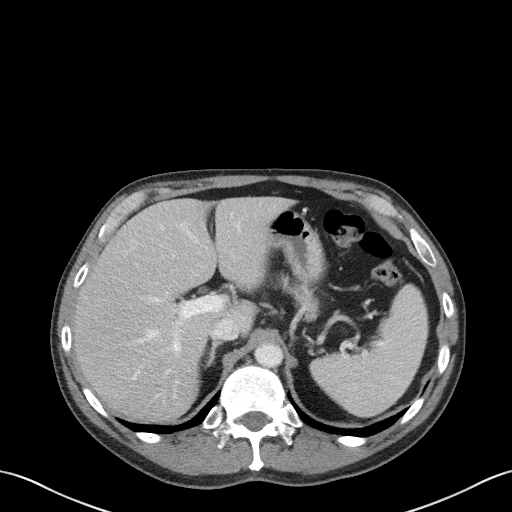
[im 84/99  soft-tissue]
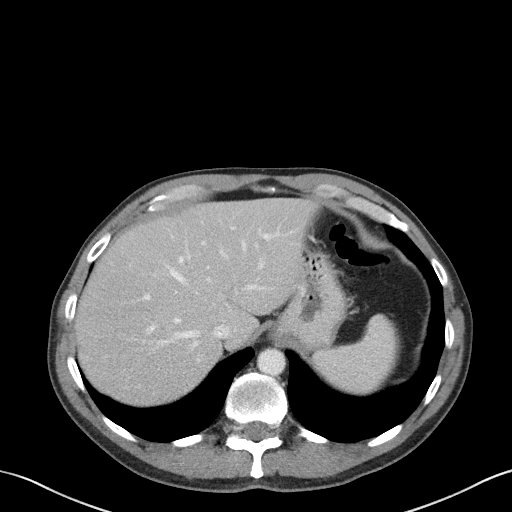
[im 94/99  soft-tissue]
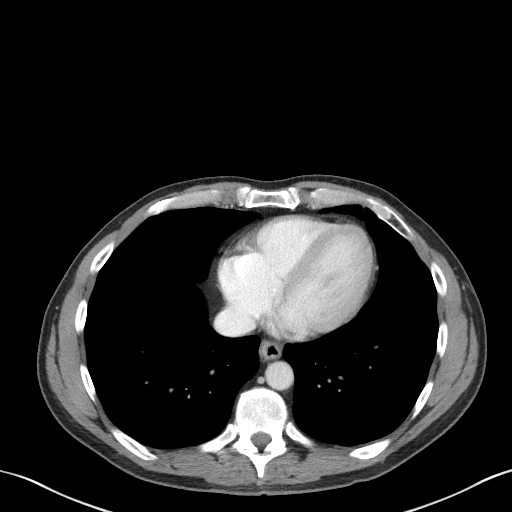

[Series 5: coronal st · coronal · 0.69mm/px · 3 of 83 slices shown]
[im 28/83  soft-tissue]
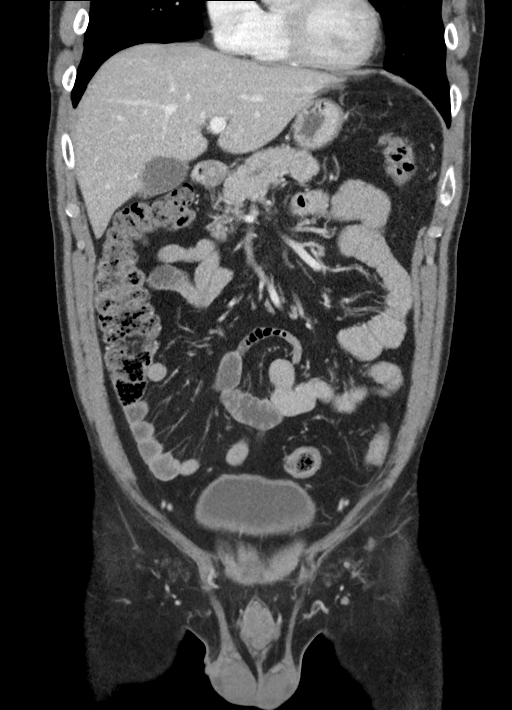
[im 37/83  soft-tissue]
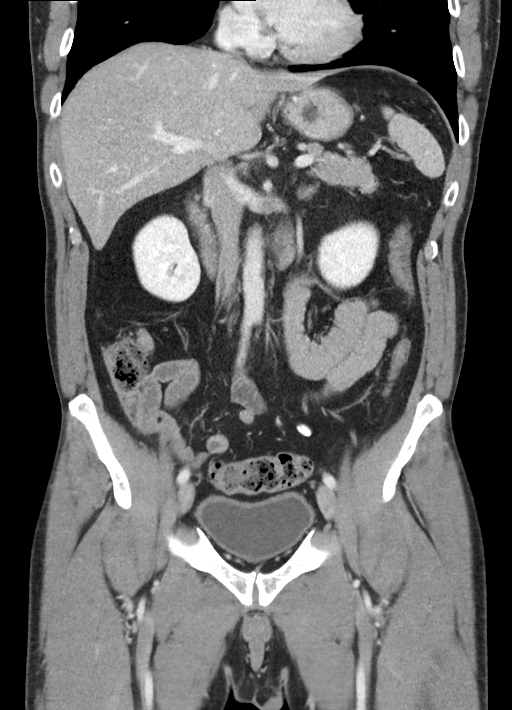
[im 46/83  soft-tissue]
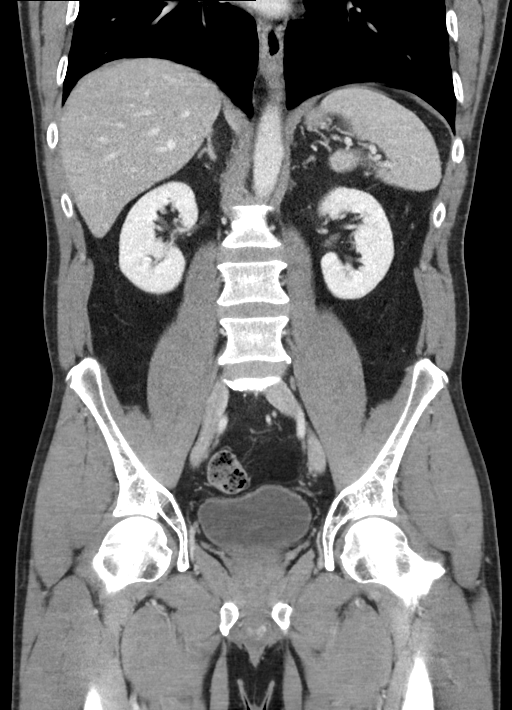

[16 of 46 positions shown; findings below may reference images not displayed]

RADIATION DOSE REDUCTION: This exam was performed according to the
departmental dose-optimization program which includes automated
exposure control, adjustment of the mA and/or kV according to
patient size and/or use of iterative reconstruction technique.

CONTRAST:  100mL OMNIPAQUE IOHEXOL 300 MG/ML  SOLN
FINDINGS: Lower chest: No acute abnormality.

Hepatobiliary: No focal liver abnormality is seen. No gallstones,
gallbladder wall thickening, or biliary dilatation.

Pancreas: Unremarkable. No pancreatic ductal dilatation or
surrounding inflammatory changes.

Spleen: Normal in size without focal abnormality.

Adrenals/Urinary Tract: Adrenal glands are unremarkable. Kidneys are
normal, without renal calculi, focal lesion, or hydronephrosis.
Bladder is unremarkable.

Stomach/Bowel: Stomach appears normal. No bowel wall thickening,
inflammation, or distension. The appendix is visualized and appears
normal.

Vascular/Lymphatic: No significant vascular findings are present. No
enlarged abdominal or pelvic lymph nodes.

Reproductive: Prostate is unremarkable.

Other: No free fluid or fluid collections identified.

Musculoskeletal: No acute or significant osseous findings.
IMPRESSION: No acute findings within the abdomen or pelvis.

## 2023-06-21 ENCOUNTER — Other Ambulatory Visit: Payer: Self-pay | Admitting: Internal Medicine

## 2023-06-21 DIAGNOSIS — B009 Herpesviral infection, unspecified: Secondary | ICD-10-CM

## 2023-06-25 NOTE — Telephone Encounter (Signed)
 Requested medication (s) are due for refill today:   Yes  Requested medication (s) are on the active medication list:   Yes  Future visit scheduled:   Yes.  9/2   Last CPE 10/20/2022   Last ordered: 04/25/2022 #90, 3 refills  Unable to refill because needs OV.    Has upcoming appt 9/2.   Last CPE 10/20/2022.  Provider to review   Requested Prescriptions  Pending Prescriptions Disp Refills   valACYclovir  (VALTREX ) 500 MG tablet [Pharmacy Med Name: VALACYCLOVIR  HCL 500 MG TABLET] 30 tablet 11    Sig: Take 1 tablet (500 mg total) by mouth daily.     Antimicrobials:  Antiviral Agents - Anti-Herpetic Failed - 06/25/2023 12:37 PM      Failed - Valid encounter within last 12 months    Recent Outpatient Visits   None     Future Appointments             In 4 months Rockney Cid, DO [place] [facility], [facility]

## 2023-10-16 ENCOUNTER — Encounter: Admitting: Internal Medicine

## 2023-10-23 ENCOUNTER — Encounter: Payer: Self-pay | Admitting: Internal Medicine

## 2023-11-12 ENCOUNTER — Encounter: Admitting: Internal Medicine

## 2023-11-13 NOTE — Progress Notes (Signed)
 Name: Brendan Santos   MRN: [id number]    DOB: [birth year]   Date:11/14/2023       Progress Note  Subjective  Chief Complaint  Chief Complaint  Patient presents with   Annual Exam    HPI  Patient presents for annual CPE.  Discussed the use of AI scribe software for clinical note transcription with the patient, who gave verbal consent to proceed.  History of Present Illness Brendan Santos is a 58 year old male who presents for an annual physical exam and medication refills.  He experiences recurrent ear infections every two to three months, primarily in the left ear, and uses ear drops for treatment. He requests a refill as his last prescription expired about a year ago. He takes care to remove water from his ear after showers to prevent infections.  He uses a muscle relaxer approximately twice a month and finds it effective. He still has some remaining from his last prescription.  He experienced a recent episode of ankle pain, attributed to gout, marking the first occurrence in several months. He was previously prescribed colchicine  but never received it due to a pharmacy issue and requests a resend of the prescription.  His medical history includes prediabetes, with an A1c of 6.0 in February 2024, improving to 5.7 by August 2024. He recalls high cholesterol levels from seven years ago but has not had recent issues.    IPSS     Row Name 11/14/23 1012         International Prostate Symptom Score   How often have you had the sensation of not emptying your bladder? Not at All     How often have you had to urinate less than every two hours? Not at All     How often have you found you stopped and started again several times when you urinated? Not at All     How often have you found it difficult to postpone urination? Not at All     How often have you had a weak urinary stream? Not at All     How often have you had to strain to start urination? Not at All     How many times  did you typically get up at night to urinate? None     Total IPSS Score 0       Quality of Life due to urinary symptoms   If you were to spend the rest of your life with your urinary condition just the way it is now how would you feel about that? Delighted        Diet: Regular Exercise: None Last Dental Exam: completed Last Eye Exam: completed  Depression: phq 9 is negative    11/14/2023   10:05 AM 12/01/2022    1:56 PM 10/20/2022    2:40 PM 04/25/2022   11:43 AM 04/14/2022    3:23 PM  Depression screen PHQ 2/9  Decreased Interest 0 0 0 0 0  Down, Depressed, Hopeless 0 0 1 0 0  PHQ - 2 Score 0 0 1 0 0  Altered sleeping   0  0  Tired, decreased energy   0  0  Change in appetite   0  0  Feeling bad or failure about yourself    0  0  Trouble concentrating   0  0  Moving slowly or fidgety/restless   0  0  Suicidal thoughts   0  0  PHQ-9 Score  1  0  Difficult doing work/chores   Not difficult at all  Not difficult at all    Hypertension:  BP Readings from Last 3 Encounters:  11/14/23 120/80  12/01/22 120/80  10/20/22 120/80    Obesity: Wt Readings from Last 3 Encounters:  11/14/23 144 lb 6.4 oz (65.5 kg)  12/01/22 143 lb 4.8 oz (65 kg)  10/20/22 144 lb 9.6 oz (65.6 kg)   BMI Readings from Last 3 Encounters:  11/14/23 23.31 kg/m  12/01/22 23.13 kg/m  10/20/22 23.34 kg/m     [employer] Visit from 11/14/2023 in [facility]  AUDIT-C Score 0   Single STD testing and prevention (HIV/chl/gon/syphilis):  no concerns Hep C Screening: completed Skin cancer: Discussed monitoring for atypical lesions. Dermatology appointment scheduled Colorectal cancer: 05/13/2022, Cologuard negative Prostate cancer:  yes Lab Results  Component Value Date   PSA 0.55 10/20/2022   PSA 0.64 08/22/2021    Lung cancer:  Low Dose CT Chest recommended if Age 49-80 years, 30 pack-year currently smoking OR have quit w/in 15years. Patient  is not a  candidate for screening   AAA: The USPSTF recommends one-time screening with ultrasonography in men ages 52 to 75 years who have ever smoked. Patient   is not a candidate for screening  ECG:  05/30/2016  Vaccines: reviewed with the patient. Discussed shingles vaccine and Prevnar 20. Patient declined Prevnar 20 and flu today but will think about getting shingles at the pharmacy.   Advanced Care Planning: A voluntary discussion about advance care planning including the explanation and discussion of advance directives.  Discussed health care proxy and Living will, and the patient was able to identify a health care proxy.  Patient does not have a living will and power of attorney of health care.   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  Family History  Problem Relation Age of Onset   Diabetes Mother    Heart disease Father    Cancer Father    Cancer Paternal Grandmother     Social History   Socioeconomic History   Marital status: Single    Spouse name: Not on file   Number of children: Not on file   Years of education: Not on file   Highest education level: Not on file  Occupational History   Not on file  Tobacco Use   Smoking status: Never   Smokeless tobacco: Never  Vaping Use   Vaping status: Never Used  Substance and Sexual Activity   Alcohol use: Not Currently    Comment: Moderate; drinks a few beers during the week, more on the weekend.   Drug use: No    Comment: History of LSD use.   Sexual activity: Yes  Other Topics Concern   Not on file  Social History Narrative   Not on file   Social Drivers of Health   Financial Resource Strain: Low Risk  (11/14/2023)   Overall Financial Resource Strain (CARDIA)    Difficulty of Paying Living Expenses: Not very hard  Food Insecurity: No Food Insecurity (11/14/2023)   Hunger Vital Sign     Worried About Running Out of Food in the Last Year: Never true    Ran Out of Food in the Last Year: Never true  Transportation Needs: No Transportation Needs (11/14/2023)   PRAPARE -  Administrator, Civil Service (Medical): No    Lack of Transportation (Non-Medical): No  Physical Activity: Inactive (11/14/2023)   Exercise Vital Sign    Days of Exercise per Week: 0 days    Minutes of Exercise per Session: 0 min  Stress: Stress Concern Present (11/14/2023)   [employer] of Occupational Health - Occupational Stress Questionnaire    Feeling of Stress: To some extent  Social Connections: Socially Isolated (11/14/2023)   Social Connection and Isolation Panel    Frequency of Communication with Friends and Family: More than three times a week    Frequency of Social Gatherings with Friends and Family: More than three times a week    Attends Religious Services: Never    Database Administrator or Organizations: No    Attends Banker Meetings: Never    Marital Status: Never married  Intimate Partner Violence: Not At Risk (11/14/2023)   Humiliation, Afraid, Rape, and Kick questionnaire    Fear of Current or Ex-Partner: No    Emotionally Abused: No    Physically Abused: No    Sexually Abused: No     Current Outpatient Medications:    tiZANidine  (ZANAFLEX ) 4 MG tablet, TAKE 1 TABLET BY MOUTH AT BEDTIME AS NEEDED FOR MUSCLE SPASMS., Disp: 30 tablet, Rfl: 2   valACYclovir  (VALTREX ) 500 MG tablet, TAKE 1 TABLET (500 MG TOTAL) BY MOUTH DAILY., Disp: 30 tablet, Rfl: 5   amoxicillin -clavulanate (AUGMENTIN ) 875-125 MG tablet, Take 1 tablet by mouth 2 (two) times daily. (Patient not taking: Reported on 11/14/2023), Disp: 20 tablet, Rfl: 0   colchicine  0.6 MG tablet, take 1.2 mg ( 2 tabs) and then may take 0.6 mg 1 hour later.  Then take 0.6 mg daily for duration of flare. (Patient not taking: Reported on 11/14/2023), Disp: 10 tablet, Rfl: 3  Allergies  Allergen Reactions    No Known Allergies      Review of Systems  All other systems reviewed and are negative.    Objective  Vitals:   11/14/23 1004  BP: 120/80  Pulse: 69  Resp: 16  Temp: 97.7 F (36.5 C)  TempSrc: Oral  SpO2: 99%  Weight: 144 lb 6.4 oz (65.5 kg)  Height: 5' 6 (1.676 m)    Body mass index is 23.31 kg/m.  Physical Exam Constitutional:      Appearance: Normal appearance.  HENT:     Head: Normocephalic and atraumatic.     Right Ear: Tympanic membrane, ear canal and external ear normal.     Left Ear: Tympanic membrane, ear canal and external ear normal.     Mouth/Throat:     Mouth: Mucous membranes are moist.     Pharynx: Oropharynx is clear.  Eyes:     Extraocular Movements: Extraocular movements intact.     Conjunctiva/sclera: Conjunctivae normal.     Pupils: Pupils are equal, round, and reactive to light.  Neck:     Comments: No thyromegaly Cardiovascular:     Rate and Rhythm: Normal rate and regular rhythm.  Pulmonary:     Effort: Pulmonary effort is normal.     Breath sounds: Normal breath sounds.  Musculoskeletal:     Cervical back: No tenderness.     Right lower leg: No edema.     Left lower leg: No edema.  Lymphadenopathy:     Cervical: No cervical adenopathy.  Skin:    General: Skin is warm and dry.  Neurological:     General: No  focal deficit present.     Mental Status: He is alert. Mental status is at baseline.  Psychiatric:        Mood and Affect: Mood normal.        Behavior: Behavior normal.     Last CBC Lab Results  Component Value Date   WBC 5.2 10/20/2022   HGB 14.4 10/20/2022   HCT 41.8 10/20/2022   MCV 95.0 10/20/2022   MCH 32.7 10/20/2022   RDW 11.8 10/20/2022   PLT 201 10/20/2022   Last metabolic panel Lab Results  Component Value Date   GLUCOSE 87 10/20/2022   NA 139 10/20/2022   K 4.4 10/20/2022   CL 101 10/20/2022   CO2 28 10/20/2022   BUN 10 10/20/2022   CREATININE 1.01 10/20/2022   EGFR 87 10/20/2022   CALCIUM  9.7 10/20/2022   PROT 7.3 10/20/2022   ALBUMIN 4.1 03/12/2021   LABGLOB 2.7 10/04/2016   AGRATIO 1.7 10/04/2016   BILITOT 1.2 10/20/2022   ALKPHOS 55 03/12/2021   AST 21 10/20/2022   ALT 31 10/20/2022   ANIONGAP 12 03/12/2021   Last lipids Lab Results  Component Value Date   CHOL 273 (H) 08/22/2021   HDL 78 08/22/2021   LDLCALC 170 (H) 08/22/2021   TRIG 120 08/22/2021   CHOLHDL 3.5 08/22/2021   Last hemoglobin A1c Lab Results  Component Value Date   HGBA1C 5.7 (H) 10/20/2022   Last thyroid functions Lab Results  Component Value Date   TSH 2.61 08/22/2021   Last vitamin D No results found for: 25OHVITD2, 25OHVITD3, VD25OH Last vitamin B12 and Folate No results found for: VITAMINB12, FOLATE    Assessment & Plan Assessment & Plan Recurrent left otitis externa Recurrent otitis externa every 2-3 months. Ear drops effective. - Prescribe ear drops for otitis externa. - Advise to remove water from ears after showering. - No signs of current infection on exam.  Gout Intermittent gout flares with recent ankle pain. Colchicine  prescription issue resolved. - Resend prescription for colchicine  to [facility] on [address]. - Advise use of ibuprofen for gout flares.  Prediabetes A1c improved from 6.0 to 5.7. Monitoring to prevent type 2 diabetes. - Order A1c test to monitor glucose control.  Hyperlipidemia Significantly elevated LDL seven years ago. - Order cholesterol test.  Adult Wellness Visit Routine visit. Blood pressure controlled. Weight stable. No STD concerns. Dental and eye exams completed. Tetanus up to date. - Perform physical exam. - Order standard lab tests including liver function, electrolytes, anemia screening, PSA, A1c, and cholesterol. - Discuss cancer screenings: Cologuard negative in 2024, next due in 2027; PSA screening with labs. - Discuss vaccinations: COVID booster, pneumonia vaccine, and shingles vaccine.  - CBC w/Diff/Platelet -  Comprehensive Metabolic Panel (CMET) - PSA - HgB A1c - Lipid Profile - neomycin -polymyxin-hydrocortisone (CORTISPORIN) OTIC solution; Place 3 drops into both ears 3 (three) times daily for 3 days.  Dispense: 10 mL; Refill: 0 - colchicine  0.6 MG tablet; take 1.2 mg ( 2 tabs) and then may take 0.6 mg 1 hour later.  Then take 0.6 mg daily for duration of flare.  Dispense: 10 tablet; Refill: 3 - tiZANidine  (ZANAFLEX ) 4 MG tablet; Take 1 tablet (4 mg total) by mouth at bedtime as needed for muscle spasms.  Dispense: 30 tablet; Refill: 2   -Prostate cancer screening and PSA options (with potential risks and benefits of testing vs not testing) were discussed along with recent recs/guidelines. -USPSTF grade A and B recommendations reviewed with patient; age-appropriate  recommendations, preventive care, screening tests, etc discussed and encouraged; healthy living encouraged; see AVS for patient education given to patient -Discussed importance of 150 minutes of physical activity weekly, eat two servings of fish weekly, eat one serving of tree nuts ( cashews, pistachios, pecans, almonds.SABRA) every other day, eat 6 servings of fruit/vegetables daily and drink plenty of water and avoid sweet beverages.  -Reviewed Health Maintenance: yes

## 2023-11-14 ENCOUNTER — Ambulatory Visit (INDEPENDENT_AMBULATORY_CARE_PROVIDER_SITE_OTHER): Admitting: Internal Medicine

## 2023-11-14 ENCOUNTER — Encounter: Payer: Self-pay | Admitting: Internal Medicine

## 2023-11-14 ENCOUNTER — Other Ambulatory Visit: Payer: Self-pay

## 2023-11-14 VITALS — BP 120/80 | HR 69 | Temp 97.7°F | Resp 16 | Ht 66.0 in | Wt 144.4 lb

## 2023-11-14 DIAGNOSIS — Z125 Encounter for screening for malignant neoplasm of prostate: Secondary | ICD-10-CM | POA: Diagnosis not present

## 2023-11-14 DIAGNOSIS — M542 Cervicalgia: Secondary | ICD-10-CM | POA: Diagnosis not present

## 2023-11-14 DIAGNOSIS — Z0000 Encounter for general adult medical examination without abnormal findings: Secondary | ICD-10-CM | POA: Diagnosis not present

## 2023-11-14 DIAGNOSIS — H9209 Otalgia, unspecified ear: Secondary | ICD-10-CM

## 2023-11-14 DIAGNOSIS — M109 Gout, unspecified: Secondary | ICD-10-CM | POA: Diagnosis not present

## 2023-11-14 DIAGNOSIS — R7303 Prediabetes: Secondary | ICD-10-CM

## 2023-11-14 DIAGNOSIS — Z13220 Encounter for screening for lipoid disorders: Secondary | ICD-10-CM

## 2023-11-14 MED ORDER — TIZANIDINE HCL 4 MG PO TABS: 4.0000 mg | ORAL_TABLET | Freq: Every evening | ORAL | 2 refills | Status: AC | PRN

## 2023-11-14 MED ORDER — COLCHICINE 0.6 MG PO TABS: ORAL_TABLET | ORAL | 3 refills | Status: AC

## 2023-11-14 MED ORDER — NEOMYCIN-POLYMYXIN-HC 3.5-10000-1 OT SOLN: 3.0000 [drp] | Freq: Three times a day (TID) | OTIC | 0 refills | Status: AC

## 2023-11-15 LAB — CBC WITH DIFFERENTIAL/PLATELET
Absolute Lymphocytes: 1156 {cells}/uL (ref 850–3900)
Absolute Monocytes: 522 {cells}/uL (ref 200–950)
Basophils Absolute: 61 {cells}/uL (ref 0–200)
Basophils Relative: 1.3 %
Eosinophils Absolute: 89 {cells}/uL (ref 15–500)
Eosinophils Relative: 1.9 %
HCT: 47.2 % (ref 38.5–50.0)
Hemoglobin: 16.2 g/dL (ref 13.2–17.1)
MCH: 32.7 pg (ref 27.0–33.0)
MCHC: 34.3 g/dL (ref 32.0–36.0)
MCV: 95.4 fL (ref 80.0–100.0)
MPV: 10.9 fL (ref 7.5–12.5)
Monocytes Relative: 11.1 %
Neutro Abs: 2872 {cells}/uL (ref 1500–7800)
Neutrophils Relative %: 61.1 %
Platelets: 228 Thousand/uL (ref 140–400)
RBC: 4.95 Million/uL (ref 4.20–5.80)
RDW: 12 % (ref 11.0–15.0)
Total Lymphocyte: 24.6 %
WBC: 4.7 Thousand/uL (ref 3.8–10.8)

## 2023-11-15 LAB — LIPID PANEL
Cholesterol: 318 mg/dL — ABNORMAL HIGH (ref <200)
HDL: 77 mg/dL (ref >=40)
LDL Cholesterol (Calc): 210 mg/dL — ABNORMAL HIGH
Non-HDL Cholesterol (Calc): 241 mg/dL — ABNORMAL HIGH (ref <130)
Total CHOL/HDL Ratio: 4.1 (calc) (ref <5.0)
Triglycerides: 147 mg/dL (ref <150)

## 2023-11-15 LAB — COMPREHENSIVE METABOLIC PANEL WITH GFR
AG Ratio: 1.4 (calc) (ref 1.0–2.5)
ALT: 50 U/L — ABNORMAL HIGH (ref 9–46)
AST: 28 U/L (ref 10–35)
Albumin: 4.8 g/dL (ref 3.6–5.1)
Alkaline phosphatase (APISO): 56 U/L (ref 35–144)
BUN: 10 mg/dL (ref 7–25)
CO2: 29 mmol/L (ref 20–32)
Calcium: 10.1 mg/dL (ref 8.6–10.3)
Chloride: 98 mmol/L (ref 98–110)
Creat: 0.88 mg/dL (ref 0.70–1.30)
Globulin: 3.5 g/dL (ref 1.9–3.7)
Glucose, Bld: 106 mg/dL — ABNORMAL HIGH (ref 65–99)
Potassium: 5 mmol/L (ref 3.5–5.3)
Sodium: 138 mmol/L (ref 135–146)
Total Bilirubin: 1.2 mg/dL (ref 0.2–1.2)
Total Protein: 8.3 g/dL — ABNORMAL HIGH (ref 6.1–8.1)
eGFR: 100 mL/min/1.73m2 (ref >=60)

## 2023-11-15 LAB — HEMOGLOBIN A1C
Hgb A1c MFr Bld: 5.7 % — ABNORMAL HIGH (ref <5.7)
Mean Plasma Glucose: 117 mg/dL
eAG (mmol/L): 6.5 mmol/L

## 2023-11-15 LAB — PSA: PSA: 0.86 ng/mL (ref <=4.00)

## 2023-11-16 ENCOUNTER — Ambulatory Visit: Payer: Self-pay | Admitting: Internal Medicine
